# Patient Record
Sex: Male | Born: 1960 | Race: White | Hispanic: No | Marital: Married | Smoking: Never smoker
Health system: Southern US, Community
[De-identification: ages and names within clinical notes are randomized; demographics above are authoritative.]

## PROBLEM LIST (undated history)

## (undated) DIAGNOSIS — J302 Other seasonal allergic rhinitis: Secondary | ICD-10-CM

## (undated) DIAGNOSIS — E119 Type 2 diabetes mellitus without complications: Secondary | ICD-10-CM

## (undated) DIAGNOSIS — J45909 Unspecified asthma, uncomplicated: Secondary | ICD-10-CM

## (undated) DIAGNOSIS — I10 Essential (primary) hypertension: Secondary | ICD-10-CM

## (undated) DIAGNOSIS — E785 Hyperlipidemia, unspecified: Secondary | ICD-10-CM

## (undated) HISTORY — PX: COLONOSCOPY: SHX174

## (undated) HISTORY — PX: KNEE ARTHROSCOPY: SUR90

## (undated) HISTORY — PX: NASAL SEPTUM SURGERY: SHX37

---

## 2007-07-24 ENCOUNTER — Encounter: Admission: RE | Admit: 2007-07-24 | Discharge: 2007-07-24 | Payer: Self-pay | Admitting: Family Medicine

## 2012-04-13 ENCOUNTER — Ambulatory Visit
Admission: RE | Admit: 2012-04-13 | Discharge: 2012-04-13 | Disposition: A | Discharge status: Home / Self Care | Payer: BC Managed Care – PPO | Source: Ambulatory Visit | Attending: Family Medicine | Admitting: Family Medicine

## 2012-04-13 ENCOUNTER — Other Ambulatory Visit: Payer: Self-pay | Admitting: Family Medicine

## 2012-04-13 DIAGNOSIS — T1490XA Injury, unspecified, initial encounter: Secondary | ICD-10-CM

## 2013-09-04 IMAGING — CR DG FINGER MIDDLE 2+V*L*
3 series · 3 of 3 positions shown · non-contrast
Comparison: None.

CLINICAL DATA: Injured left middle finger.

LEFT MIDDLE FINGER 2+V

[x finger pa left]
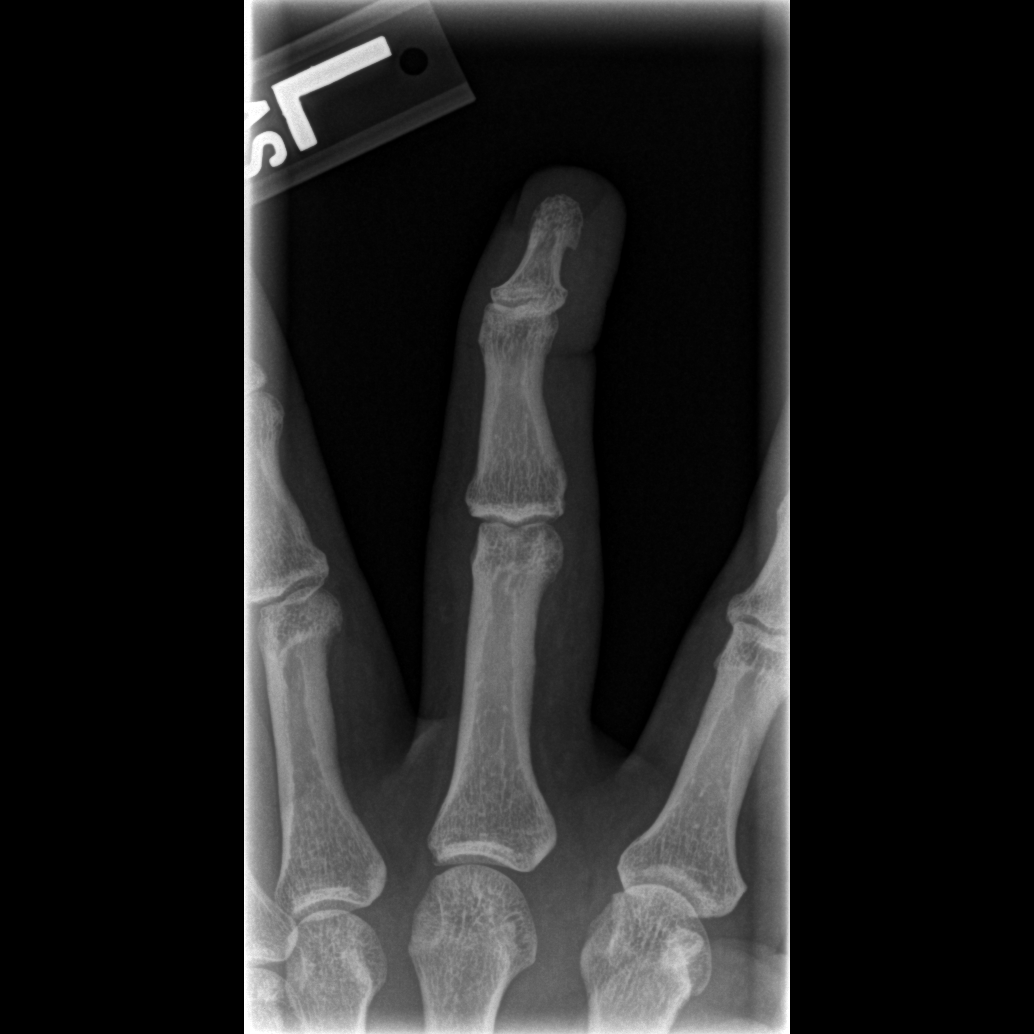

[x finger obl. left]
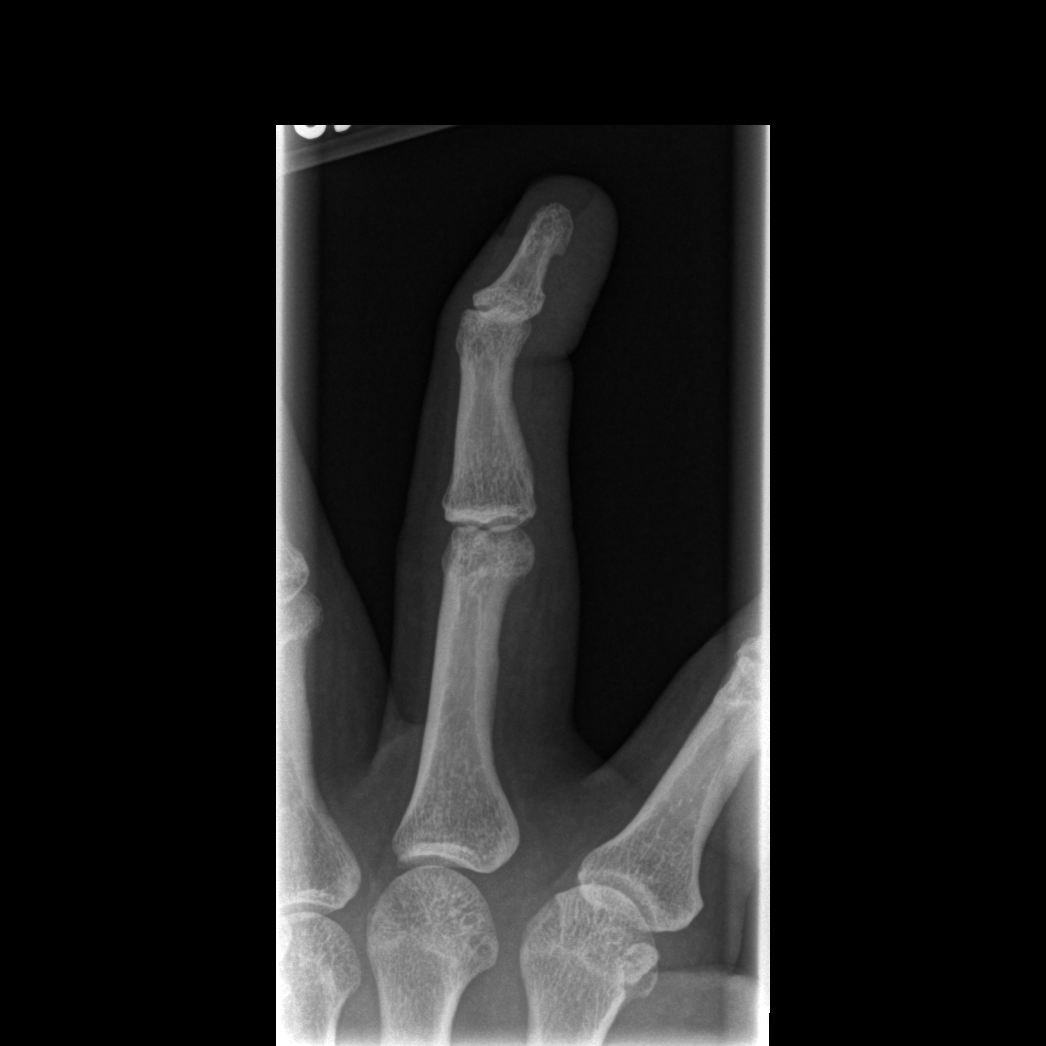

[x finger lateral left]
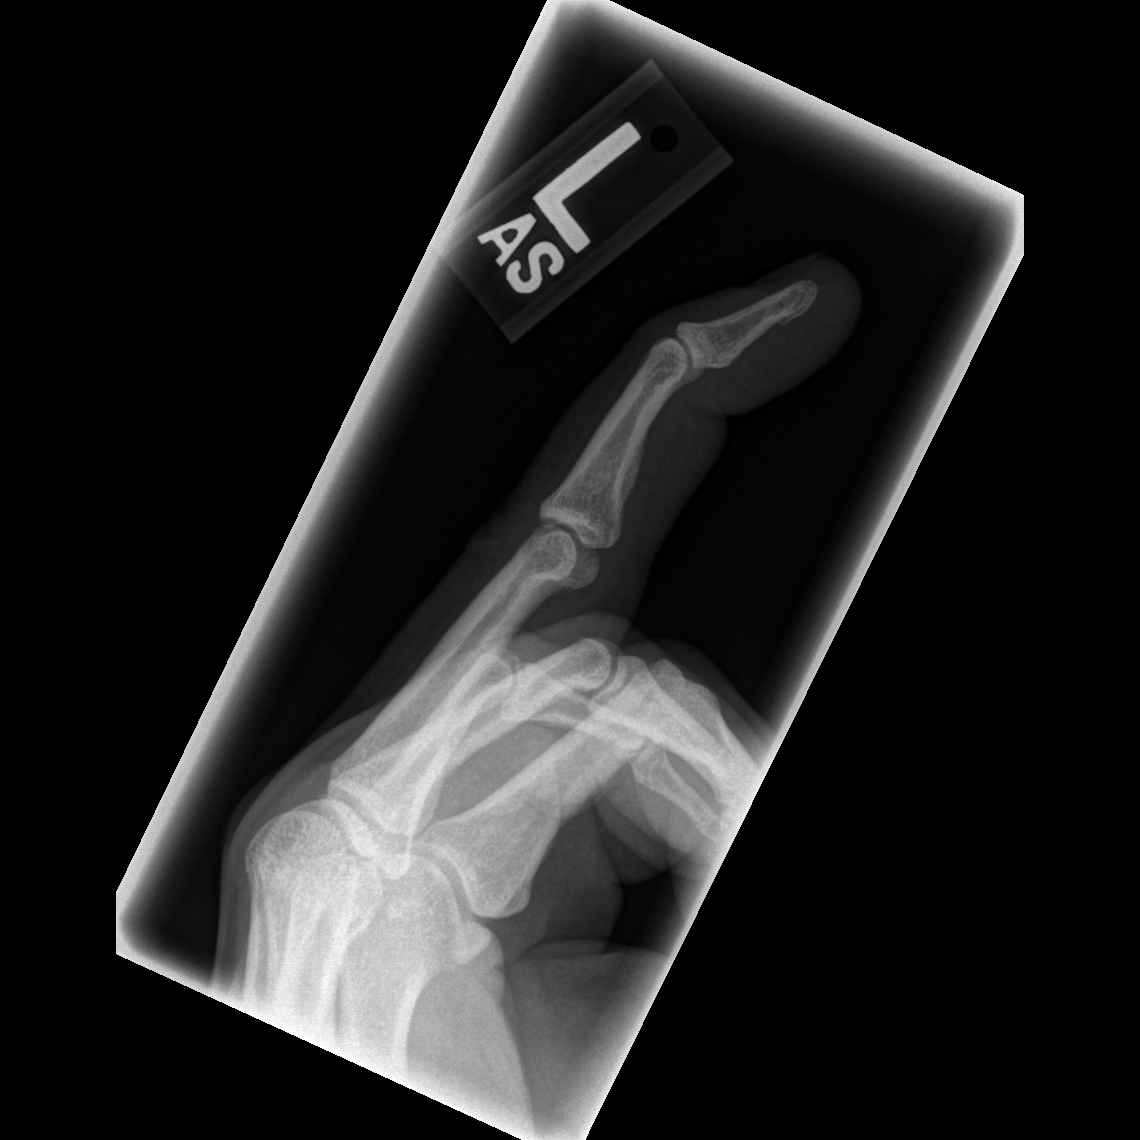

[3 of 3 positions shown; findings below may reference images not displayed]

FINDINGS: There is a flexion deformity at the DIP joint.  No
fracture is identified.  Findings consistent with an extensor
tendon rupture.
IMPRESSION: 1.  No acute fracture.
2.  Flexion deformity at the DIP joint consistent with an extensor
tendon rupture.

## 2013-10-24 ENCOUNTER — Telehealth: Payer: Self-pay | Admitting: Cardiology

## 2013-10-24 NOTE — Telephone Encounter (Signed)
Please let patient now that he had very mild sleep apnea with no significant drop in O2 sats during sleep. Most of his apneas were while he was sleeping on his back. I have recommended that he try to avoid sleeping supine. His epworth sleepiness scale was very low at 1 so I do not think that CPAP is warranted at this time. I have recommended that his PCP consider a referral to ENT for surgical evaluation of mild OSA and snoring.  Please forward to PCP

## 2013-10-25 NOTE — Telephone Encounter (Signed)
LVM to make pt aware and faxed results to PCP Dr. Blair Heys

## 2013-11-27 ENCOUNTER — Encounter: Payer: Self-pay | Admitting: Cardiology

## 2016-07-25 DIAGNOSIS — I10 Essential (primary) hypertension: Secondary | ICD-10-CM | POA: Diagnosis not present

## 2016-07-25 DIAGNOSIS — E782 Mixed hyperlipidemia: Secondary | ICD-10-CM | POA: Diagnosis not present

## 2016-07-25 DIAGNOSIS — E119 Type 2 diabetes mellitus without complications: Secondary | ICD-10-CM | POA: Diagnosis not present

## 2017-01-06 DIAGNOSIS — L719 Rosacea, unspecified: Secondary | ICD-10-CM | POA: Diagnosis not present

## 2017-01-06 DIAGNOSIS — L259 Unspecified contact dermatitis, unspecified cause: Secondary | ICD-10-CM | POA: Diagnosis not present

## 2017-01-26 DIAGNOSIS — E119 Type 2 diabetes mellitus without complications: Secondary | ICD-10-CM | POA: Diagnosis not present

## 2017-01-26 DIAGNOSIS — E782 Mixed hyperlipidemia: Secondary | ICD-10-CM | POA: Diagnosis not present

## 2017-01-26 DIAGNOSIS — I10 Essential (primary) hypertension: Secondary | ICD-10-CM | POA: Diagnosis not present

## 2017-03-20 DIAGNOSIS — E119 Type 2 diabetes mellitus without complications: Secondary | ICD-10-CM | POA: Diagnosis not present

## 2017-06-29 DIAGNOSIS — L570 Actinic keratosis: Secondary | ICD-10-CM | POA: Diagnosis not present

## 2017-07-27 DIAGNOSIS — E1165 Type 2 diabetes mellitus with hyperglycemia: Secondary | ICD-10-CM | POA: Diagnosis not present

## 2017-07-27 DIAGNOSIS — Z125 Encounter for screening for malignant neoplasm of prostate: Secondary | ICD-10-CM | POA: Diagnosis not present

## 2017-07-27 DIAGNOSIS — I10 Essential (primary) hypertension: Secondary | ICD-10-CM | POA: Diagnosis not present

## 2017-07-27 DIAGNOSIS — E782 Mixed hyperlipidemia: Secondary | ICD-10-CM | POA: Diagnosis not present

## 2017-07-27 DIAGNOSIS — Z7984 Long term (current) use of oral hypoglycemic drugs: Secondary | ICD-10-CM | POA: Diagnosis not present

## 2018-01-29 DIAGNOSIS — E782 Mixed hyperlipidemia: Secondary | ICD-10-CM | POA: Diagnosis not present

## 2018-01-29 DIAGNOSIS — E119 Type 2 diabetes mellitus without complications: Secondary | ICD-10-CM | POA: Diagnosis not present

## 2018-01-29 DIAGNOSIS — I10 Essential (primary) hypertension: Secondary | ICD-10-CM | POA: Diagnosis not present

## 2018-03-22 DIAGNOSIS — E119 Type 2 diabetes mellitus without complications: Secondary | ICD-10-CM | POA: Diagnosis not present

## 2018-05-17 DIAGNOSIS — L718 Other rosacea: Secondary | ICD-10-CM | POA: Diagnosis not present

## 2018-05-17 DIAGNOSIS — L309 Dermatitis, unspecified: Secondary | ICD-10-CM | POA: Diagnosis not present

## 2018-05-17 DIAGNOSIS — L218 Other seborrheic dermatitis: Secondary | ICD-10-CM | POA: Diagnosis not present

## 2018-07-30 DIAGNOSIS — I10 Essential (primary) hypertension: Secondary | ICD-10-CM | POA: Diagnosis not present

## 2018-07-30 DIAGNOSIS — E782 Mixed hyperlipidemia: Secondary | ICD-10-CM | POA: Diagnosis not present

## 2018-07-30 DIAGNOSIS — E119 Type 2 diabetes mellitus without complications: Secondary | ICD-10-CM | POA: Diagnosis not present

## 2018-11-01 DIAGNOSIS — R197 Diarrhea, unspecified: Secondary | ICD-10-CM | POA: Diagnosis not present

## 2018-11-01 DIAGNOSIS — R109 Unspecified abdominal pain: Secondary | ICD-10-CM | POA: Diagnosis not present

## 2018-11-02 DIAGNOSIS — R197 Diarrhea, unspecified: Secondary | ICD-10-CM | POA: Diagnosis not present

## 2019-01-30 DIAGNOSIS — I10 Essential (primary) hypertension: Secondary | ICD-10-CM | POA: Diagnosis not present

## 2019-01-30 DIAGNOSIS — E782 Mixed hyperlipidemia: Secondary | ICD-10-CM | POA: Diagnosis not present

## 2019-01-30 DIAGNOSIS — E119 Type 2 diabetes mellitus without complications: Secondary | ICD-10-CM | POA: Diagnosis not present

## 2019-05-24 DIAGNOSIS — Z03818 Encounter for observation for suspected exposure to other biological agents ruled out: Secondary | ICD-10-CM | POA: Diagnosis not present

## 2019-07-19 DIAGNOSIS — K5730 Diverticulosis of large intestine without perforation or abscess without bleeding: Secondary | ICD-10-CM | POA: Diagnosis not present

## 2019-07-19 DIAGNOSIS — K648 Other hemorrhoids: Secondary | ICD-10-CM | POA: Diagnosis not present

## 2019-07-19 DIAGNOSIS — Z86010 Personal history of colonic polyps: Secondary | ICD-10-CM | POA: Diagnosis not present

## 2019-07-19 DIAGNOSIS — D124 Benign neoplasm of descending colon: Secondary | ICD-10-CM | POA: Diagnosis not present

## 2019-07-31 DIAGNOSIS — Z125 Encounter for screening for malignant neoplasm of prostate: Secondary | ICD-10-CM | POA: Diagnosis not present

## 2019-07-31 DIAGNOSIS — I10 Essential (primary) hypertension: Secondary | ICD-10-CM | POA: Diagnosis not present

## 2019-07-31 DIAGNOSIS — L719 Rosacea, unspecified: Secondary | ICD-10-CM | POA: Diagnosis not present

## 2019-07-31 DIAGNOSIS — E119 Type 2 diabetes mellitus without complications: Secondary | ICD-10-CM | POA: Diagnosis not present

## 2019-07-31 DIAGNOSIS — E782 Mixed hyperlipidemia: Secondary | ICD-10-CM | POA: Diagnosis not present

## 2019-08-12 DIAGNOSIS — E119 Type 2 diabetes mellitus without complications: Secondary | ICD-10-CM | POA: Diagnosis not present

## 2020-01-07 DIAGNOSIS — Z1152 Encounter for screening for COVID-19: Secondary | ICD-10-CM | POA: Diagnosis not present

## 2020-01-09 ENCOUNTER — Other Ambulatory Visit: Payer: Self-pay

## 2020-01-09 ENCOUNTER — Encounter (HOSPITAL_COMMUNITY): Payer: Self-pay

## 2020-01-09 ENCOUNTER — Inpatient Hospital Stay (HOSPITAL_COMMUNITY)
Admission: EM | Admit: 2020-01-09 | Discharge: 2020-01-14 | DRG: 177 | Disposition: A | Discharge status: Home / Self Care | Payer: BC Managed Care – PPO | Attending: Internal Medicine | Admitting: Internal Medicine

## 2020-01-09 ENCOUNTER — Emergency Department (HOSPITAL_COMMUNITY): Payer: BC Managed Care – PPO

## 2020-01-09 DIAGNOSIS — Z23 Encounter for immunization: Secondary | ICD-10-CM | POA: Diagnosis not present

## 2020-01-09 DIAGNOSIS — J45909 Unspecified asthma, uncomplicated: Secondary | ICD-10-CM | POA: Diagnosis not present

## 2020-01-09 DIAGNOSIS — E1165 Type 2 diabetes mellitus with hyperglycemia: Secondary | ICD-10-CM

## 2020-01-09 DIAGNOSIS — J9601 Acute respiratory failure with hypoxia: Secondary | ICD-10-CM | POA: Diagnosis not present

## 2020-01-09 DIAGNOSIS — U071 COVID-19: Secondary | ICD-10-CM | POA: Diagnosis not present

## 2020-01-09 DIAGNOSIS — T380X5A Adverse effect of glucocorticoids and synthetic analogues, initial encounter: Secondary | ICD-10-CM | POA: Diagnosis not present

## 2020-01-09 DIAGNOSIS — N400 Enlarged prostate without lower urinary tract symptoms: Secondary | ICD-10-CM | POA: Diagnosis not present

## 2020-01-09 DIAGNOSIS — I10 Essential (primary) hypertension: Secondary | ICD-10-CM | POA: Diagnosis present

## 2020-01-09 DIAGNOSIS — E0965 Drug or chemical induced diabetes mellitus with hyperglycemia: Secondary | ICD-10-CM | POA: Diagnosis not present

## 2020-01-09 DIAGNOSIS — E785 Hyperlipidemia, unspecified: Secondary | ICD-10-CM | POA: Diagnosis not present

## 2020-01-09 DIAGNOSIS — E871 Hypo-osmolality and hyponatremia: Secondary | ICD-10-CM | POA: Diagnosis not present

## 2020-01-09 DIAGNOSIS — D696 Thrombocytopenia, unspecified: Secondary | ICD-10-CM | POA: Diagnosis present

## 2020-01-09 DIAGNOSIS — J1282 Pneumonia due to coronavirus disease 2019: Secondary | ICD-10-CM | POA: Diagnosis present

## 2020-01-09 DIAGNOSIS — R197 Diarrhea, unspecified: Secondary | ICD-10-CM | POA: Diagnosis present

## 2020-01-09 DIAGNOSIS — J45901 Unspecified asthma with (acute) exacerbation: Secondary | ICD-10-CM | POA: Diagnosis not present

## 2020-01-09 DIAGNOSIS — F1010 Alcohol abuse, uncomplicated: Secondary | ICD-10-CM | POA: Diagnosis not present

## 2020-01-09 DIAGNOSIS — R0602 Shortness of breath: Secondary | ICD-10-CM | POA: Diagnosis not present

## 2020-01-09 HISTORY — DX: Unspecified asthma, uncomplicated: J45.909

## 2020-01-09 HISTORY — DX: Essential (primary) hypertension: I10

## 2020-01-09 HISTORY — DX: Type 2 diabetes mellitus without complications: E11.9

## 2020-01-09 LAB — CBC WITH DIFFERENTIAL/PLATELET
Abs Immature Granulocytes: 0.02 10*3/uL (ref 0.00–0.07)
Basophils Absolute: 0 10*3/uL (ref 0.0–0.1)
Basophils Relative: 0 %
Eosinophils Absolute: 0 10*3/uL (ref 0.0–0.5)
Eosinophils Relative: 0 %
HCT: 44 % (ref 39.0–52.0)
Hemoglobin: 14.9 g/dL (ref 13.0–17.0)
Immature Granulocytes: 0 %
Lymphocytes Relative: 22 %
Lymphs Abs: 1.1 10*3/uL (ref 0.7–4.0)
MCH: 31.9 pg (ref 26.0–34.0)
MCHC: 33.9 g/dL (ref 30.0–36.0)
MCV: 94.2 fL (ref 80.0–100.0)
Monocytes Absolute: 0.6 10*3/uL (ref 0.1–1.0)
Monocytes Relative: 12 %
Neutro Abs: 3.4 10*3/uL (ref 1.7–7.7)
Neutrophils Relative %: 66 %
Platelets: 144 10*3/uL — ABNORMAL LOW (ref 150–400)
RBC: 4.67 MIL/uL (ref 4.22–5.81)
RDW: 12.2 % (ref 11.5–15.5)
WBC: 5.1 10*3/uL (ref 4.0–10.5)
nRBC: 0 % (ref 0.0–0.2)

## 2020-01-09 LAB — GLUCOSE, CAPILLARY
Glucose-Capillary: 292 mg/dL — ABNORMAL HIGH (ref 70–99)
Glucose-Capillary: 385 mg/dL — ABNORMAL HIGH (ref 70–99)

## 2020-01-09 LAB — COMPREHENSIVE METABOLIC PANEL
ALT: 48 U/L — ABNORMAL HIGH (ref 0–44)
AST: 43 U/L — ABNORMAL HIGH (ref 15–41)
Albumin: 3.2 g/dL — ABNORMAL LOW (ref 3.5–5.0)
Alkaline Phosphatase: 42 U/L (ref 38–126)
Anion gap: 10 (ref 5–15)
BUN: 21 mg/dL — ABNORMAL HIGH (ref 6–20)
CO2: 24 mmol/L (ref 22–32)
Calcium: 8.3 mg/dL — ABNORMAL LOW (ref 8.9–10.3)
Chloride: 98 mmol/L (ref 98–111)
Creatinine, Ser: 1.14 mg/dL (ref 0.61–1.24)
GFR calc Af Amer: >60 mL/min (ref >60)
GFR calc non Af Amer: >60 mL/min (ref >60)
Glucose, Bld: 268 mg/dL — ABNORMAL HIGH (ref 70–99)
Potassium: 4.1 mmol/L (ref 3.5–5.1)
Sodium: 132 mmol/L — ABNORMAL LOW (ref 135–145)
Total Bilirubin: 0.6 mg/dL (ref 0.3–1.2)
Total Protein: 6.9 g/dL (ref 6.5–8.1)

## 2020-01-09 LAB — PHOSPHORUS: Phosphorus: 2.7 mg/dL (ref 2.5–4.6)

## 2020-01-09 LAB — TRIGLYCERIDES: Triglycerides: 211 mg/dL — ABNORMAL HIGH (ref <150)

## 2020-01-09 LAB — LACTIC ACID, PLASMA: Lactic Acid, Venous: 1.5 mmol/L (ref 0.5–1.9)

## 2020-01-09 LAB — LACTATE DEHYDROGENASE: LDH: 260 U/L — ABNORMAL HIGH (ref 98–192)

## 2020-01-09 LAB — PROCALCITONIN: Procalcitonin: <0.1 ng/mL

## 2020-01-09 LAB — C-REACTIVE PROTEIN: CRP: 1 mg/dL — ABNORMAL HIGH (ref <1.0)

## 2020-01-09 LAB — HIV ANTIBODY (ROUTINE TESTING W REFLEX): HIV Screen 4th Generation wRfx: NONREACTIVE

## 2020-01-09 LAB — FERRITIN: Ferritin: 1246 ng/mL — ABNORMAL HIGH (ref 24–336)

## 2020-01-09 LAB — POC SARS CORONAVIRUS 2 AG -  ED: SARS Coronavirus 2 Ag: POSITIVE — AB

## 2020-01-09 LAB — MAGNESIUM: Magnesium: 1.9 mg/dL (ref 1.7–2.4)

## 2020-01-09 LAB — HEMOGLOBIN A1C
Hgb A1c MFr Bld: 8.2 % — ABNORMAL HIGH (ref 4.8–5.6)
Mean Plasma Glucose: 188.64 mg/dL

## 2020-01-09 LAB — D-DIMER, QUANTITATIVE: D-Dimer, Quant: 0.57 ug/mL-FEU — ABNORMAL HIGH (ref 0.00–0.50)

## 2020-01-09 LAB — FIBRINOGEN: Fibrinogen: 657 mg/dL — ABNORMAL HIGH (ref 210–475)

## 2020-01-09 MED ORDER — HYDRALAZINE HCL 25 MG PO TABS: 25.0000 mg | ORAL_TABLET | Freq: Four times a day (QID) | ORAL | Status: DC | PRN

## 2020-01-09 MED ORDER — THIAMINE HCL 100 MG PO TABS
100.0000 mg | ORAL_TABLET | Freq: Every day | ORAL | Status: DC
  Administered 2020-01-09 – 2020-01-14 (×6): 100 mg via ORAL
  Filled 2020-01-09 (×6): qty 1

## 2020-01-09 MED ORDER — SODIUM CHLORIDE 0.9 % IV SOLN
200.0000 mg | Freq: Once | INTRAVENOUS | Status: AC
  Administered 2020-01-09: 200 mg via INTRAVENOUS
  Filled 2020-01-09 (×2): qty 40

## 2020-01-09 MED ORDER — DEXAMETHASONE 6 MG PO TABS
6.0000 mg | ORAL_TABLET | Freq: Every day | ORAL | Status: DC
  Administered 2020-01-09 – 2020-01-10 (×2): 6 mg via ORAL
  Filled 2020-01-09: qty 2
  Filled 2020-01-09 (×2): qty 1

## 2020-01-09 MED ORDER — ASCORBIC ACID 500 MG PO TABS
500.0000 mg | ORAL_TABLET | Freq: Every day | ORAL | Status: DC
  Administered 2020-01-09 – 2020-01-14 (×6): 500 mg via ORAL
  Filled 2020-01-09 (×6): qty 1

## 2020-01-09 MED ORDER — ENOXAPARIN SODIUM 40 MG/0.4ML ~~LOC~~ SOLN: 40.0000 mg | SUBCUTANEOUS | Status: DC

## 2020-01-09 MED ORDER — FOLIC ACID 1 MG PO TABS
1.0000 mg | ORAL_TABLET | Freq: Every day | ORAL | Status: DC
  Administered 2020-01-09 – 2020-01-14 (×6): 1 mg via ORAL
  Filled 2020-01-09 (×6): qty 1

## 2020-01-09 MED ORDER — ZINC SULFATE 220 (50 ZN) MG PO CAPS
220.0000 mg | ORAL_CAPSULE | Freq: Every day | ORAL | Status: DC
  Administered 2020-01-09 – 2020-01-14 (×6): 220 mg via ORAL
  Filled 2020-01-09 (×7): qty 1

## 2020-01-09 MED ORDER — GUAIFENESIN-DM 100-10 MG/5ML PO SYRP
10.0000 mL | ORAL_SOLUTION | ORAL | Status: DC | PRN
  Administered 2020-01-12: 10 mL via ORAL
  Filled 2020-01-09: qty 10

## 2020-01-09 MED ORDER — METFORMIN HCL 500 MG PO TABS
500.0000 mg | ORAL_TABLET | Freq: Two times a day (BID) | ORAL | Status: DC
  Administered 2020-01-09 – 2020-01-10 (×2): 500 mg via ORAL
  Filled 2020-01-09 (×2): qty 1

## 2020-01-09 MED ORDER — ADULT MULTIVITAMIN W/MINERALS CH
1.0000 | ORAL_TABLET | Freq: Every day | ORAL | Status: DC
  Administered 2020-01-09 – 2020-01-14 (×6): 1 via ORAL
  Filled 2020-01-09 (×6): qty 1

## 2020-01-09 MED ORDER — SODIUM CHLORIDE 0.9 % IV SOLN
100.0000 mg | Freq: Every day | INTRAVENOUS | Status: AC
  Administered 2020-01-10 – 2020-01-13 (×4): 100 mg via INTRAVENOUS
  Filled 2020-01-09 (×4): qty 20

## 2020-01-09 MED ORDER — ALBUTEROL SULFATE HFA 108 (90 BASE) MCG/ACT IN AERS
4.0000 | INHALATION_SPRAY | RESPIRATORY_TRACT | Status: DC | PRN
  Filled 2020-01-09: qty 6.7

## 2020-01-09 MED ORDER — LORAZEPAM 1 MG PO TABS: 1.0000 mg | ORAL_TABLET | ORAL | Status: AC | PRN

## 2020-01-09 MED ORDER — ALBUTEROL SULFATE HFA 108 (90 BASE) MCG/ACT IN AERS
2.0000 | INHALATION_SPRAY | Freq: Four times a day (QID) | RESPIRATORY_TRACT | Status: DC
  Administered 2020-01-09 – 2020-01-10 (×6): 2 via RESPIRATORY_TRACT
  Filled 2020-01-09: qty 6.7

## 2020-01-09 MED ORDER — LOPERAMIDE HCL 2 MG PO CAPS
2.0000 mg | ORAL_CAPSULE | Freq: Four times a day (QID) | ORAL | Status: DC | PRN
  Filled 2020-01-09: qty 1

## 2020-01-09 MED ORDER — ACETAMINOPHEN 325 MG PO TABS: 650.0000 mg | ORAL_TABLET | Freq: Four times a day (QID) | ORAL | Status: DC | PRN

## 2020-01-09 MED ORDER — INSULIN ASPART 100 UNIT/ML ~~LOC~~ SOLN
0.0000 [IU] | Freq: Three times a day (TID) | SUBCUTANEOUS | Status: DC
  Administered 2020-01-09: 5 [IU] via SUBCUTANEOUS
  Administered 2020-01-10 – 2020-01-11 (×4): 7 [IU] via SUBCUTANEOUS
  Administered 2020-01-11: 9 [IU] via SUBCUTANEOUS
  Administered 2020-01-11: 5 [IU] via SUBCUTANEOUS
  Administered 2020-01-12: 3 [IU] via SUBCUTANEOUS
  Administered 2020-01-12: 9 [IU] via SUBCUTANEOUS
  Administered 2020-01-12: 3 [IU] via SUBCUTANEOUS
  Administered 2020-01-13: 17:00:00 9 [IU] via SUBCUTANEOUS
  Administered 2020-01-13: 12:00:00 3 [IU] via SUBCUTANEOUS
  Administered 2020-01-13: 2 [IU] via SUBCUTANEOUS
  Administered 2020-01-14: 1 [IU] via SUBCUTANEOUS
  Administered 2020-01-14: 5 [IU] via SUBCUTANEOUS

## 2020-01-09 MED ORDER — ENOXAPARIN SODIUM 60 MG/0.6ML ~~LOC~~ SOLN
55.0000 mg | SUBCUTANEOUS | Status: DC
  Administered 2020-01-09 – 2020-01-13 (×5): 55 mg via SUBCUTANEOUS
  Filled 2020-01-09 (×5): qty 0.6

## 2020-01-09 MED ORDER — LORAZEPAM 2 MG/ML IJ SOLN: 1.0000 mg | INTRAMUSCULAR | Status: AC | PRN

## 2020-01-09 MED ORDER — PNEUMOCOCCAL VAC POLYVALENT 25 MCG/0.5ML IJ INJ
0.5000 mL | INJECTION | INTRAMUSCULAR | Status: AC
  Administered 2020-01-10: 0.5 mL via INTRAMUSCULAR
  Filled 2020-01-09: qty 0.5

## 2020-01-09 MED ORDER — INFLUENZA VAC SPLIT QUAD 0.5 ML IM SUSY
0.5000 mL | PREFILLED_SYRINGE | INTRAMUSCULAR | Status: AC
  Administered 2020-01-10: 0.5 mL via INTRAMUSCULAR
  Filled 2020-01-09: qty 0.5

## 2020-01-09 MED ORDER — THIAMINE HCL 100 MG/ML IJ SOLN
100.0000 mg | Freq: Every day | INTRAMUSCULAR | Status: DC
  Filled 2020-01-09: qty 2

## 2020-01-09 NOTE — ED Triage Notes (Signed)
Pt here from home covid positive has been feeling bad since Saturday and tested positive on  Tuesday , pt does have asthma stas 94 % in triage

## 2020-01-09 NOTE — ED Provider Notes (Signed)
[facility] EMERGENCY DEPARTMENT Provider Note   CSN: [id number] Arrival date & time: 01/09/20  8891     History No chief complaint on file.   Earl Francis is a 59 y.o. male.  HPI Patient presents with shortness of breath.  Covid positive as an outpatient 4 days ago.  Symptoms began 6 days ago.  Has had a cough.  Fatigue.  Diarrhea.  History of asthma.  States he normally only has to use his inhaler like twice a year.  Has been much more short of breath.  States he has trouble getting up and moving around.  States he has a pulse ox at home that would go down to 84 or 88%.  States this would occur at rest.    Past Medical History:  Diagnosis Date  . Asthma   . Diabetes mellitus without complication (HCC)   . Hypertension     There are no problems to display for this patient.   Past Surgical History:  Procedure Laterality Date  . KNEE ARTHROSCOPY Left        History reviewed. No pertinent family history.  Social History   Tobacco Use  . Smoking status: Never Smoker  . Smokeless tobacco: Never Used  Substance Use Topics  . Alcohol use: Yes    Alcohol/week: 4.0 standard drinks    Types: 4 Cans of beer per week  . Drug use: Not Currently    Home Medications Prior to Admission medications   Not on File    Allergies    Patient has no allergy information on record.  Review of Systems   Review of Systems  Constitutional: Positive for appetite change, fatigue and fever.  HENT: Negative for congestion.   Respiratory: Positive for cough and shortness of breath.   Cardiovascular: Negative for chest pain and leg swelling.  Gastrointestinal: Negative for abdominal pain.  Genitourinary: Negative for flank pain.  Musculoskeletal: Negative for back pain.  Skin: Negative for rash.  Neurological: Negative for weakness.  Psychiatric/Behavioral: Negative for confusion.    Physical Exam Updated Vital Signs BP 127/70   Pulse 93   Temp 99.4 F  (37.4 C) (Oral)   Resp (!) 22   Ht 5\' 11"  (1.803 m)   Wt 106.6 kg   SpO2 93%   BMI 32.78 kg/m   Physical Exam Vitals and nursing note reviewed.  HENT:     Head: Normocephalic.     Mouth/Throat:     Mouth: Mucous membranes are moist.  Eyes:     General: No scleral icterus. Cardiovascular:     Rate and Rhythm: Regular rhythm.  Pulmonary:     Breath sounds: No wheezing or rhonchi.     Comments: Tachypnea.  Patient seems more short of breath wearing his mask.  Lungs overall clear. Abdominal:     Tenderness: There is no abdominal tenderness.  Musculoskeletal:     Right lower leg: No edema.     Left lower leg: No edema.  Skin:    General: Skin is warm.     Capillary Refill: Capillary refill takes less than 2 seconds.  Neurological:     Mental Status: He is alert and oriented to person, place, and time.     ED Results / Procedures / Treatments   Labs (all labs ordered are listed, but only abnormal results are displayed) Labs Reviewed  CBC WITH DIFFERENTIAL/PLATELET - Abnormal; Notable for the following components:      Result Value   Platelets 144 (*)  All other components within normal limits  COMPREHENSIVE METABOLIC PANEL - Abnormal; Notable for the following components:   Sodium 132 (*)    Glucose, Bld 268 (*)    BUN 21 (*)    Calcium 8.3 (*)    Albumin 3.2 (*)    AST 43 (*)    ALT 48 (*)    All other components within normal limits  D-DIMER, QUANTITATIVE (NOT AT [facility]) - Abnormal; Notable for the following components:   D-Dimer, Quant 0.57 (*)    All other components within normal limits  LACTATE DEHYDROGENASE - Abnormal; Notable for the following components:   LDH 260 (*)    All other components within normal limits  TRIGLYCERIDES - Abnormal; Notable for the following components:   Triglycerides 211 (*)    All other components within normal limits  FIBRINOGEN - Abnormal; Notable for the following components:   Fibrinogen 657 (*)    All other components  within normal limits  POC SARS CORONAVIRUS 2 AG -  ED - Abnormal; Notable for the following components:   SARS Coronavirus 2 Ag POSITIVE (*)    All other components within normal limits  CULTURE, BLOOD (ROUTINE X 2)  CULTURE, BLOOD (ROUTINE X 2)  LACTIC ACID, PLASMA  LACTIC ACID, PLASMA  PROCALCITONIN  FERRITIN  C-REACTIVE PROTEIN    EKG EKG Interpretation  Date/Time:  Thursday January 09 2020 10:02:32 EST Ventricular Rate:  88 PR Interval:    QRS Duration: 102 QT Interval:  371 QTC Calculation: 449 R Axis:   -6 Text Interpretation: Sinus rhythm Confirmed by Davonna Belling [phone]) on 01/09/2020 11:00:55 AM   Radiology DG Chest Port 1 View  Result Date: 01/09/2020 CLINICAL DATA:  COVID-19 positive.  Short of breath EXAM: PORTABLE CHEST 1 VIEW COMPARISON:  None. FINDINGS: Mild bibasilar airspace disease left greater than right. Possible pneumonia or atelectasis. Decreased lung volume. Negative for heart failure or effusion. IMPRESSION: Mild bibasilar airspace disease which could be pneumonia or atelectasis. Hypoventilation with decreased lung volume. Electronically Signed   By: Franchot Gallo M.D.   On: 01/09/2020 10:56    Procedures Procedures (including critical care time)  Medications Ordered in ED Medications  albuterol (VENTOLIN HFA) 108 (90 Base) MCG/ACT inhaler 4 puff (has no administration in time range)    ED Course  I have reviewed the triage vital signs and the nursing notes.  Pertinent labs & imaging results that were available during my care of the patient were reviewed by me and considered in my medical decision making (see chart for details).    MDM Rules/Calculators/A&P                      Patient with COVID-19.  History of asthma at home.  Increasing dyspnea.  With home monitoring sats went down to 84%.  Very dyspneic here.  Sats at rest down to 93.  With ambulation he did not drop but respiratory rate increases.  At rest when I reevaluated him his  respiratory rate was 33.  Feels the patient may benefit from admission to the hospital for further treatment.  Will discuss with hospitalist. Final Clinical Impression(s) / ED Diagnoses Final diagnoses:  COVID-19  Asthma, unspecified asthma severity, unspecified whether complicated, unspecified whether persistent    Rx / DC Orders ED Discharge Orders    None       Davonna Belling, MD 01/09/20 1148

## 2020-01-09 NOTE — ED Notes (Signed)
Pt ambulatory in room 95-97% on RA

## 2020-01-09 NOTE — H&P (Addendum)
History and Physical    Earsel Francis GYJ:[id number] DOB: [birth year] DOA: 01/09/2020  PCP: System, Pcp Not In   Patient coming from: Home  I have personally briefly reviewed patient's old medical records in [place]  Chief Complaint: SOB  HPI: Earl Francis is a 59 y.o. male with medical history significant of IDDM, HTN, presented with increasing short of breath and hypoxia for 8 days.  Covid positive as an outpatient 4 days ago.    He has had dry cough, fatigue,  loose diarrhea, low-grade subjective fever with no chills.    For last 2 days, he has been much more short of breath.  States he has trouble getting up and moving around.  He checked his pulse ox at home last night, which was 84 or 88%.    This morning his breathing symptoms worsen, significant short of breath with minimum activity. ED Course: ED physician found the patient very tachypneic, talking in broken sentences, breathing rate increased to >30s with minimum activity.  Review of Systems: As per HPI otherwise 10 point review of systems negative.    Past Medical History:  Diagnosis Date  . Asthma   . Diabetes mellitus without complication ([place])   . Hypertension     Past Surgical History:  Procedure Laterality Date  . KNEE ARTHROSCOPY Left      reports that he has never smoked. He has never used smokeless tobacco. He reports current alcohol use of about 4.0 standard drinks of alcohol per week. He reports previous drug use.  Not on File  FH, no DM or HTN in family    Prior to Admission medications   Not on File    Physical Exam: Vitals:   01/09/20 0916 01/09/20 1013 01/09/20 1149  BP: 127/70    Pulse: 93    Resp: (!) 22    Temp: 99.4 F (37.4 C)    TempSrc: Oral    SpO2: 93%  94%  Weight:  106.6 kg   Height:  5\' 11"  (1.803 m)     Constitutional: NAD, calm, comfortable Vitals:   01/09/20 0916 01/09/20 1013 01/09/20 1149  BP: 127/70    Pulse: 93    Resp: (!) 22    Temp: 99.4 F (37.4 C)     TempSrc: Oral    SpO2: 93%  94%  Weight:  106.6 kg   Height:  5\' 11"  (1.803 m)    Eyes: PERRL, lids and conjunctivae normal ENMT: Mucous membranes are moist. Posterior pharynx clear of any exudate or lesions.Normal dentition.  Neck: normal, supple, no masses, no thyromegaly Respiratory: Coarse to bilateral mid levels, and scattered wheezing.  Increasing respiratory effort, talking in broken sentences.  Positive for accessory muscle use.  Cardiovascular: Regular rate and rhythm, no murmurs / rubs / gallops. No extremity edema. 2+ pedal pulses. No carotid bruits.  Abdomen: no tenderness, no masses palpated. No hepatosplenomegaly. Bowel sounds positive.  Musculoskeletal: no clubbing / cyanosis. No joint deformity upper and lower extremities. Good ROM, no contractures. Normal muscle tone.  Skin: no rashes, lesions, ulcers. No induration Neurologic: CN 2-12 grossly intact. Sensation intact, DTR normal. Strength 5/5 in all 4.  Psychiatric: Normal judgment and insight. Alert and oriented x 3. Normal mood.     Labs on Admission: I have personally reviewed following labs and imaging studies  CBC: Recent Labs  Lab 01/09/20 1015  WBC 5.1  NEUTROABS 3.4  HGB 14.9  HCT 44.0  MCV 94.2  PLT 144*  Basic Metabolic Panel: Recent Labs  Lab 01/09/20 1015  NA 132*  K 4.1  CL 98  CO2 24  GLUCOSE 268*  BUN 21*  CREATININE 1.14  CALCIUM 8.3*   GFR: Estimated Creatinine Clearance: 89.9 mL/min (by C-G formula based on SCr of 1.14 mg/dL). Liver Function Tests: Recent Labs  Lab 01/09/20 1015  AST 43*  ALT 48*  ALKPHOS 42  BILITOT 0.6  PROT 6.9  ALBUMIN 3.2*   No results for input(s): LIPASE, AMYLASE in the last 168 hours. No results for input(s): AMMONIA in the last 168 hours. Coagulation Profile: No results for input(s): INR, PROTIME in the last 168 hours. Cardiac Enzymes: No results for input(s): CKTOTAL, CKMB, CKMBINDEX, TROPONINI in the last 168 hours. BNP (last 3  results) No results for input(s): PROBNP in the last 8760 hours. HbA1C: No results for input(s): HGBA1C in the last 72 hours. CBG: No results for input(s): GLUCAP in the last 168 hours. Lipid Profile: Recent Labs    01/09/20 0944  TRIG 211*   Thyroid Function Tests: No results for input(s): TSH, T4TOTAL, FREET4, T3FREE, THYROIDAB in the last 72 hours. Anemia Panel: No results for input(s): VITAMINB12, FOLATE, FERRITIN, TIBC, IRON, RETICCTPCT in the last 72 hours. Urine analysis: No results found for: COLORURINE, APPEARANCEUR, LABSPEC, PHURINE, GLUCOSEU, HGBUR, BILIRUBINUR, KETONESUR, PROTEINUR, UROBILINOGEN, NITRITE, LEUKOCYTESUR  Radiological Exams on Admission: DG Chest Port 1 View  Result Date: 01/09/2020 CLINICAL DATA:  COVID-19 positive.  Short of breath EXAM: PORTABLE CHEST 1 VIEW COMPARISON:  None. FINDINGS: Mild bibasilar airspace disease left greater than right. Possible pneumonia or atelectasis. Decreased lung volume. Negative for heart failure or effusion. IMPRESSION: Mild bibasilar airspace disease which could be pneumonia or atelectasis. Hypoventilation with decreased lung volume. Electronically Signed   By: Marlan Palau M.D.   On: 01/09/2020 10:56    EKG: Independently reviewed.   Assessment/Plan Active Problems:   COVID-19 virus infection   COVID-19  COVID 19 pneumonia with impending respiratory failure, discussed with ED physician patient very symptomatic with significant tachypnea, will place patient on telemetry observation for tonight and start patient on remdesivir and steroid.  COVID-19 lab reviewed.  Asthma with mild exacerbation Inhaler and steroid.  Poorly controlled diabetes, patient claimed his A1c was 6.1 last year and has been on Metformin alone therapy for 5 years. Continue Metformin, add sliding scale.  HTN, BP controlled, as needed hydralazine for now, consider restarting home BP meds.  Thrombocytopenia, unknown etiology and chronity, repeat  CBC tomorrow.  EtOH abuse, not s/s of withdrawal, will monitor CIWA with PRN meds.    DVT prophylaxis: Lovenox Code Status: Full code Family Communication: None at bedside Disposition Plan: If symptoms improved, can be discharged home tomorrow and complete remdesivir course as outpatient Consults called: None Admission status: Tele Obs   Emeline General MD [facility] Pager [phone]  If 7PM-7AM, please contact night-coverage [email or web address] Password [facility]  01/09/2020, 12:41 PM

## 2020-01-10 DIAGNOSIS — E1165 Type 2 diabetes mellitus with hyperglycemia: Secondary | ICD-10-CM | POA: Diagnosis not present

## 2020-01-10 DIAGNOSIS — F1010 Alcohol abuse, uncomplicated: Secondary | ICD-10-CM | POA: Diagnosis present

## 2020-01-10 DIAGNOSIS — J45901 Unspecified asthma with (acute) exacerbation: Secondary | ICD-10-CM | POA: Diagnosis present

## 2020-01-10 DIAGNOSIS — J1282 Pneumonia due to coronavirus disease 2019: Secondary | ICD-10-CM | POA: Diagnosis present

## 2020-01-10 DIAGNOSIS — Z23 Encounter for immunization: Secondary | ICD-10-CM | POA: Diagnosis not present

## 2020-01-10 DIAGNOSIS — D696 Thrombocytopenia, unspecified: Secondary | ICD-10-CM | POA: Diagnosis present

## 2020-01-10 DIAGNOSIS — U071 COVID-19: Secondary | ICD-10-CM | POA: Diagnosis present

## 2020-01-10 DIAGNOSIS — E871 Hypo-osmolality and hyponatremia: Secondary | ICD-10-CM | POA: Diagnosis not present

## 2020-01-10 DIAGNOSIS — N400 Enlarged prostate without lower urinary tract symptoms: Secondary | ICD-10-CM | POA: Diagnosis present

## 2020-01-10 DIAGNOSIS — E785 Hyperlipidemia, unspecified: Secondary | ICD-10-CM | POA: Diagnosis present

## 2020-01-10 DIAGNOSIS — I10 Essential (primary) hypertension: Secondary | ICD-10-CM | POA: Diagnosis present

## 2020-01-10 DIAGNOSIS — R197 Diarrhea, unspecified: Secondary | ICD-10-CM | POA: Diagnosis present

## 2020-01-10 DIAGNOSIS — T380X5A Adverse effect of glucocorticoids and synthetic analogues, initial encounter: Secondary | ICD-10-CM | POA: Diagnosis not present

## 2020-01-10 DIAGNOSIS — E0965 Drug or chemical induced diabetes mellitus with hyperglycemia: Secondary | ICD-10-CM | POA: Diagnosis not present

## 2020-01-10 DIAGNOSIS — J9601 Acute respiratory failure with hypoxia: Secondary | ICD-10-CM | POA: Diagnosis present

## 2020-01-10 LAB — CBC WITH DIFFERENTIAL/PLATELET
Abs Immature Granulocytes: 0.03 10*3/uL (ref 0.00–0.07)
Basophils Absolute: 0 10*3/uL (ref 0.0–0.1)
Basophils Relative: 0 %
Eosinophils Absolute: 0 10*3/uL (ref 0.0–0.5)
Eosinophils Relative: 0 %
HCT: 40.6 % (ref 39.0–52.0)
Hemoglobin: 14.4 g/dL (ref 13.0–17.0)
Immature Granulocytes: 1 %
Lymphocytes Relative: 23 %
Lymphs Abs: 1 10*3/uL (ref 0.7–4.0)
MCH: 32.1 pg (ref 26.0–34.0)
MCHC: 35.5 g/dL (ref 30.0–36.0)
MCV: 90.4 fL (ref 80.0–100.0)
Monocytes Absolute: 0.6 10*3/uL (ref 0.1–1.0)
Monocytes Relative: 14 %
Neutro Abs: 2.7 10*3/uL (ref 1.7–7.7)
Neutrophils Relative %: 62 %
Platelets: 167 10*3/uL (ref 150–400)
RBC: 4.49 MIL/uL (ref 4.22–5.81)
RDW: 12 % (ref 11.5–15.5)
WBC: 4.3 10*3/uL (ref 4.0–10.5)
nRBC: 0 % (ref 0.0–0.2)

## 2020-01-10 LAB — COMPREHENSIVE METABOLIC PANEL
ALT: 49 U/L — ABNORMAL HIGH (ref 0–44)
AST: 38 U/L (ref 15–41)
Albumin: 3 g/dL — ABNORMAL LOW (ref 3.5–5.0)
Alkaline Phosphatase: 43 U/L (ref 38–126)
Anion gap: 12 (ref 5–15)
BUN: 27 mg/dL — ABNORMAL HIGH (ref 6–20)
CO2: 19 mmol/L — ABNORMAL LOW (ref 22–32)
Calcium: 8.2 mg/dL — ABNORMAL LOW (ref 8.9–10.3)
Chloride: 101 mmol/L (ref 98–111)
Creatinine, Ser: 0.99 mg/dL (ref 0.61–1.24)
GFR calc Af Amer: >60 mL/min (ref >60)
GFR calc non Af Amer: >60 mL/min (ref >60)
Glucose, Bld: 336 mg/dL — ABNORMAL HIGH (ref 70–99)
Potassium: 4.4 mmol/L (ref 3.5–5.1)
Sodium: 132 mmol/L — ABNORMAL LOW (ref 135–145)
Total Bilirubin: 0.7 mg/dL (ref 0.3–1.2)
Total Protein: 6.7 g/dL (ref 6.5–8.1)

## 2020-01-10 LAB — GLUCOSE, CAPILLARY
Glucose-Capillary: 303 mg/dL — ABNORMAL HIGH (ref 70–99)
Glucose-Capillary: 329 mg/dL — ABNORMAL HIGH (ref 70–99)
Glucose-Capillary: 324 mg/dL — ABNORMAL HIGH (ref 70–99)
Glucose-Capillary: 353 mg/dL — ABNORMAL HIGH (ref 70–99)

## 2020-01-10 LAB — C-REACTIVE PROTEIN: CRP: 1.8 mg/dL — ABNORMAL HIGH (ref <1.0)

## 2020-01-10 LAB — PHOSPHORUS: Phosphorus: 3.1 mg/dL (ref 2.5–4.6)

## 2020-01-10 LAB — FERRITIN: Ferritin: 1424 ng/mL — ABNORMAL HIGH (ref 24–336)

## 2020-01-10 LAB — BRAIN NATRIURETIC PEPTIDE: B Natriuretic Peptide: 23.7 pg/mL (ref 0.0–100.0)

## 2020-01-10 LAB — D-DIMER, QUANTITATIVE: D-Dimer, Quant: 0.35 ug/mL-FEU (ref 0.00–0.50)

## 2020-01-10 LAB — MAGNESIUM: Magnesium: 2 mg/dL (ref 1.7–2.4)

## 2020-01-10 MED ORDER — IPRATROPIUM BROMIDE HFA 17 MCG/ACT IN AERS
2.0000 | INHALATION_SPRAY | Freq: Four times a day (QID) | RESPIRATORY_TRACT | Status: DC
  Administered 2020-01-10 (×2): 2 via RESPIRATORY_TRACT
  Filled 2020-01-10: qty 12.9

## 2020-01-10 MED ORDER — IPRATROPIUM BROMIDE HFA 17 MCG/ACT IN AERS
2.0000 | INHALATION_SPRAY | Freq: Three times a day (TID) | RESPIRATORY_TRACT | Status: DC
  Administered 2020-01-11 – 2020-01-13 (×7): 2 via RESPIRATORY_TRACT
  Filled 2020-01-10: qty 12.9

## 2020-01-10 MED ORDER — DEXAMETHASONE SODIUM PHOSPHATE 10 MG/ML IJ SOLN
6.0000 mg | INTRAMUSCULAR | Status: DC
  Administered 2020-01-11 – 2020-01-14 (×4): 6 mg via INTRAVENOUS
  Filled 2020-01-10 (×4): qty 1

## 2020-01-10 MED ORDER — HYDROCOD POLST-CPM POLST ER 10-8 MG/5ML PO SUER
5.0000 mL | Freq: Two times a day (BID) | ORAL | Status: DC
  Administered 2020-01-10 – 2020-01-11 (×3): 5 mL via ORAL
  Filled 2020-01-10 (×3): qty 5

## 2020-01-10 MED ORDER — INSULIN ASPART 100 UNIT/ML ~~LOC~~ SOLN
6.0000 [IU] | Freq: Three times a day (TID) | SUBCUTANEOUS | Status: DC
  Administered 2020-01-10 – 2020-01-13 (×8): 6 [IU] via SUBCUTANEOUS

## 2020-01-10 MED ORDER — INSULIN DETEMIR 100 UNIT/ML ~~LOC~~ SOLN
10.0000 [IU] | Freq: Two times a day (BID) | SUBCUTANEOUS | Status: DC
  Administered 2020-01-10 (×2): 10 [IU] via SUBCUTANEOUS
  Filled 2020-01-10 (×4): qty 0.1

## 2020-01-10 MED ORDER — VITAMIN D 25 MCG (1000 UNIT) PO TABS
1000.0000 [IU] | ORAL_TABLET | Freq: Every day | ORAL | Status: DC
  Administered 2020-01-10 – 2020-01-14 (×5): 1000 [IU] via ORAL
  Filled 2020-01-10 (×5): qty 1

## 2020-01-10 MED ORDER — ALBUTEROL SULFATE HFA 108 (90 BASE) MCG/ACT IN AERS
2.0000 | INHALATION_SPRAY | Freq: Three times a day (TID) | RESPIRATORY_TRACT | Status: DC
  Administered 2020-01-11 – 2020-01-13 (×7): 2 via RESPIRATORY_TRACT
  Filled 2020-01-10: qty 6.7

## 2020-01-10 NOTE — Plan of Care (Signed)

## 2020-01-10 NOTE — Progress Notes (Signed)
Inpatient Diabetes Program Recommendations  AACE/ADA: New Consensus Statement on Inpatient Glycemic Control (2015)  Target Ranges:  Prepandial:   less than 140 mg/dL      Peak postprandial:   less than 180 mg/dL (1-2 hours)      Critically ill patients:  140 - 180 mg/dL   Lab Results  Component Value Date   GLUCAP 329 (H) 01/10/2020   HGBA1C 8.2 (H) 01/09/2020    Review of Glycemic Control Results for Earl Francis, Earl Francis (MRN [id number]) as of 01/10/2020 15:21  Ref. Range 01/09/2020 16:36 01/09/2020 21:16 01/10/2020 08:04 01/10/2020 11:41  Glucose-Capillary Latest Ref Range: 70 - 99 mg/dL 672 (H) 091 (H) 980 (H) 329 (H)   Diabetes history: DM 2 Outpatient Diabetes medications:  Metformin 1000 mg daily Current orders for Inpatient glycemic control:  Novolog sensitive tid with meals Decadron 6 mg daily Inpatient Diabetes Program Recommendations:    Consider adding Levemir 15 units bid and Novolog 6 units tid with meals.  Notified MD.   Thanks  Beryl Meager, RN, BC-ADM Inpatient Diabetes Coordinator Pager [phone] (8a-5p)

## 2020-01-10 NOTE — Progress Notes (Signed)
PROGRESS NOTE  Earl Francis XTK:[id number] DOB: 1961/06/03 DOA: 01/09/2020 PCP: System, Pcp Not In  HPI/Recap of past 24 hours:  Earl Francis is a 59 y.o. male with medical history significant of IDDM, HTN, presented with increasing short of breath and hypoxia for 8 days.  Covid positive as an outpatient 4 days prior to presentation.     Associated with nonproductive cough, fatigue and diarrhea. States he has trouble getting up and moving around.    On the day of presentation his breathing became worse and decided to come to the ED for further evaluation.  In-house COVID-19 test positive on 01/09/2020.  Chest x-ray bilateral pulmonary infiltrates worse at bases.  Procalcitonin negative.  TRH was asked to admit.  Started on COVID-19 directed therapies.   01/10/20: Seen and examined.  Reports dyspnea with minimal exertion.  Still requiring 4 L nasal cannula to maintain O2 saturation greater than 90%.  Admits to persistent nonproductive cough.  Assessment/Plan: Active Problems:   COVID-19 virus infection   COVID-19  COVID-19 viral pneumonia Presented with dyspnea and hypoxia In-house positive COVID-19 test done on 01/09/2020. Currently requiring 4 L to maintain O2 saturation greater than 90% Independently viewed chest x-ray done on admission which showed bilateral pulmonary infiltrates worse at bases. Started on COVID-19 directed therapies Day number 2 out of 5 of remdesivir Day number 2 out of 10 of Decadron Continue bronchodilators albuterol inhaler 2 puffs every 6 hours and ipratropium inhaler 2 puffs every 6 hours Add incentive spirometer and flutter valve Continue vitamin C, zinc and D3 Continue DVT prophylaxis Trend inflammatory markers daily Maintain O2 saturation greater than 92% Mobilize with assistance and fall precautions  Acute hypoxic respiratory failure secondary to COVID-19 viral pneumonia Not on oxygen supplementation at baseline Currently requiring 4 L to maintain O2  saturation greater than 90% Rest of management as stated above.  Type 2 diabetes with hyperglycemia Hemoglobin A1c 8.2 Hyperglycemia likely exacerbated by steroids Diabetes coordinator assisting. Levemir 10 units twice daily and NovoLog 6 units 3 times daily. Continue insulin sliding scale  Hyperlipidemia Resume home medication  Essential hypertension Blood pressure stable Resume home medications  BPH Resume home medication  Alcohol use disorder No evidence of alcohol withdrawal Continue multivitamin, folic acid and thiamine.   DVT prophylaxis: Lovenox subcu daily Code Status: Full code Family Communication: None at bedside   Disposition Plan:  Patient is from home.  Anticipate discharge to home once oxygen saturation is close to baseline.  Barrier to discharge persistent hypoxia and dyspnea, ongoing COVID-19 directed therapies.   Consults called: None   Objective: Vitals:   01/10/20 0048 01/10/20 0425 01/10/20 1015 01/10/20 1230  BP: 113/72 115/73  138/84  Pulse: 77 76 84 85  Resp: (!) 24  18 17   Temp: 98 F (36.7 C) 97.7 F (36.5 C)  97.9 F (36.6 C)  TempSrc: Oral Oral  Oral  SpO2: 94% 93% 95% 95%  Weight:      Height:        Intake/Output Summary (Last 24 hours) at 01/10/2020 1501 Last data filed at 01/10/2020 1200 Gross per 24 hour  Intake 1505 ml  Output 1425 ml  Net 80 ml   Filed Weights   01/09/20 1013  Weight: 106.6 kg    Exam:  . General: 59 y.o. year-old male well developed well nourished in no acute distress.  Alert and oriented x3. . Cardiovascular: Regular rate and rhythm with no rubs or gallops.  No thyromegaly or JVD noted.   59  Respiratory: Diffuse rales bilaterally.  Poor inspiratory effort. . Abdomen: Soft nontender nondistended with normal bowel sounds x4 quadrants. . Musculoskeletal: No lower extremity edema. 2/4 pulses in all 4 extremities. Marland Kitchen Psychiatry: Mood is appropriate for condition and setting   Data  Reviewed: CBC: Recent Labs  Lab 01/09/20 1015 01/10/20 0525  WBC 5.1 4.3  NEUTROABS 3.4 2.7  HGB 14.9 14.4  HCT 44.0 40.6  MCV 94.2 90.4  PLT 144* 167   Basic Metabolic Panel: Recent Labs  Lab 01/09/20 1015 01/09/20 1540 01/10/20 0525  NA 132*  --  132*  K 4.1  --  4.4  CL 98  --  101  CO2 24  --  19*  GLUCOSE 268*  --  336*  BUN 21*  --  27*  CREATININE 1.14  --  0.99  CALCIUM 8.3*  --  8.2*  MG  --  1.9 2.0  PHOS  --  2.7 3.1   GFR: Estimated Creatinine Clearance: 103.5 mL/min (by C-G formula based on SCr of 0.99 mg/dL). Liver Function Tests: Recent Labs  Lab 01/09/20 1015 01/10/20 0525  AST 43* 38  ALT 48* 49*  ALKPHOS 42 43  BILITOT 0.6 0.7  PROT 6.9 6.7  ALBUMIN 3.2* 3.0*   No results for input(s): LIPASE, AMYLASE in the last 168 hours. No results for input(s): AMMONIA in the last 168 hours. Coagulation Profile: No results for input(s): INR, PROTIME in the last 168 hours. Cardiac Enzymes: No results for input(s): CKTOTAL, CKMB, CKMBINDEX, TROPONINI in the last 168 hours. BNP (last 3 results) No results for input(s): PROBNP in the last 8760 hours. HbA1C: Recent Labs    01/09/20 1235  HGBA1C 8.2*   CBG: Recent Labs  Lab 01/09/20 1636 01/09/20 2116 01/10/20 0804 01/10/20 1141  GLUCAP 292* 385* 303* 329*   Lipid Profile: Recent Labs    01/09/20 0944  TRIG 211*   Thyroid Function Tests: No results for input(s): TSH, T4TOTAL, FREET4, T3FREE, THYROIDAB in the last 72 hours. Anemia Panel: Recent Labs    01/09/20 1015 01/10/20 0525  FERRITIN 1,246* 1,424*   Urine analysis: No results found for: COLORURINE, APPEARANCEUR, LABSPEC, PHURINE, GLUCOSEU, HGBUR, BILIRUBINUR, KETONESUR, PROTEINUR, UROBILINOGEN, NITRITE, LEUKOCYTESUR Sepsis Labs: @LABRCNTIP (procalcitonin:4,lacticidven:4)  ) Recent Results (from the past 240 hour(s))  Blood Culture (routine x 2)     Status: None (Preliminary result)   Collection Time: 01/09/20 10:15 AM    Specimen: BLOOD  Result Value Ref Range Status   Specimen Description BLOOD RIGHT ANTECUBITAL  Final   Special Requests   Final    BOTTLES DRAWN AEROBIC AND ANAEROBIC Blood Culture adequate volume   Culture   Final    NO GROWTH < 24 HOURS Performed at [facility], [address], [place], [place]    Report Status PENDING  Incomplete  Blood Culture (routine x 2)     Status: None (Preliminary result)   Collection Time: 01/09/20  2:55 PM   Specimen: BLOOD  Result Value Ref Range Status   Specimen Description BLOOD LEFT ANTECUBITAL  Final   Special Requests   Final    BOTTLES DRAWN AEROBIC AND ANAEROBIC Blood Culture adequate volume   Culture   Final    NO GROWTH < 24 HOURS Performed at [facility], [address] [place], [place]    Report Status PENDING  Incomplete      Studies: No results found.  Scheduled Meds: . albuterol  2 puff Inhalation  Q6H  . vitamin C  500 mg Oral Daily  . cholecalciferol  1,000 Units Oral Daily  . dexamethasone  6 mg Oral Daily  . enoxaparin (LOVENOX) injection  55 mg Subcutaneous Q24H  . folic acid  1 mg Oral Daily  . insulin aspart  0-9 Units Subcutaneous TID WC  . metFORMIN  500 mg Oral BID WC  . multivitamin with minerals  1 tablet Oral Daily  . thiamine  100 mg Oral Daily   Or  . thiamine  100 mg Intravenous Daily  . zinc sulfate  220 mg Oral Daily    Continuous Infusions: . remdesivir 100 mg in NS 100 mL 100 mg (01/10/20 0846)     LOS: 0 days     Kayleen Memos, MD [facility] Pager [phone]  If 7PM-7AM, please contact night-coverage [email or web address] Password [facility] 01/10/2020, 3:01 PM

## 2020-01-11 DIAGNOSIS — J9601 Acute respiratory failure with hypoxia: Secondary | ICD-10-CM

## 2020-01-11 DIAGNOSIS — U071 COVID-19: Principal | ICD-10-CM

## 2020-01-11 DIAGNOSIS — E1165 Type 2 diabetes mellitus with hyperglycemia: Secondary | ICD-10-CM

## 2020-01-11 LAB — CBC WITH DIFFERENTIAL/PLATELET
Abs Immature Granulocytes: 0.03 10*3/uL (ref 0.00–0.07)
Basophils Absolute: 0 10*3/uL (ref 0.0–0.1)
Basophils Relative: 0 %
Eosinophils Absolute: 0 10*3/uL (ref 0.0–0.5)
Eosinophils Relative: 0 %
HCT: 37.9 % — ABNORMAL LOW (ref 39.0–52.0)
Hemoglobin: 13.2 g/dL (ref 13.0–17.0)
Immature Granulocytes: 1 %
Lymphocytes Relative: 23 %
Lymphs Abs: 1.3 10*3/uL (ref 0.7–4.0)
MCH: 31.9 pg (ref 26.0–34.0)
MCHC: 34.8 g/dL (ref 30.0–36.0)
MCV: 91.5 fL (ref 80.0–100.0)
Monocytes Absolute: 0.7 10*3/uL (ref 0.1–1.0)
Monocytes Relative: 13 %
Neutro Abs: 3.6 10*3/uL (ref 1.7–7.7)
Neutrophils Relative %: 63 %
Platelets: 176 10*3/uL (ref 150–400)
RBC: 4.14 MIL/uL — ABNORMAL LOW (ref 4.22–5.81)
RDW: 11.9 % (ref 11.5–15.5)
WBC: 5.6 10*3/uL (ref 4.0–10.5)
nRBC: 0 % (ref 0.0–0.2)

## 2020-01-11 LAB — MAGNESIUM: Magnesium: 2.3 mg/dL (ref 1.7–2.4)

## 2020-01-11 LAB — GLUCOSE, CAPILLARY
Glucose-Capillary: 259 mg/dL — ABNORMAL HIGH (ref 70–99)
Glucose-Capillary: 304 mg/dL — ABNORMAL HIGH (ref 70–99)
Glucose-Capillary: 390 mg/dL — ABNORMAL HIGH (ref 70–99)
Glucose-Capillary: 355 mg/dL — ABNORMAL HIGH (ref 70–99)

## 2020-01-11 LAB — COMPREHENSIVE METABOLIC PANEL
ALT: 45 U/L — ABNORMAL HIGH (ref 0–44)
AST: 32 U/L (ref 15–41)
Albumin: 2.6 g/dL — ABNORMAL LOW (ref 3.5–5.0)
Alkaline Phosphatase: 41 U/L (ref 38–126)
Anion gap: 11 (ref 5–15)
BUN: 28 mg/dL — ABNORMAL HIGH (ref 6–20)
CO2: 22 mmol/L (ref 22–32)
Calcium: 8.3 mg/dL — ABNORMAL LOW (ref 8.9–10.3)
Chloride: 98 mmol/L (ref 98–111)
Creatinine, Ser: 0.94 mg/dL (ref 0.61–1.24)
GFR calc Af Amer: >60 mL/min (ref >60)
GFR calc non Af Amer: >60 mL/min (ref >60)
Glucose, Bld: 286 mg/dL — ABNORMAL HIGH (ref 70–99)
Potassium: 4 mmol/L (ref 3.5–5.1)
Sodium: 131 mmol/L — ABNORMAL LOW (ref 135–145)
Total Bilirubin: 0.6 mg/dL (ref 0.3–1.2)
Total Protein: 6.2 g/dL — ABNORMAL LOW (ref 6.5–8.1)

## 2020-01-11 LAB — FERRITIN: Ferritin: 1264 ng/mL — ABNORMAL HIGH (ref 24–336)

## 2020-01-11 LAB — PHOSPHORUS: Phosphorus: 4 mg/dL (ref 2.5–4.6)

## 2020-01-11 LAB — C-REACTIVE PROTEIN: CRP: 0.9 mg/dL (ref <1.0)

## 2020-01-11 LAB — D-DIMER, QUANTITATIVE: D-Dimer, Quant: 0.36 ug/mL-FEU (ref 0.00–0.50)

## 2020-01-11 MED ORDER — LINAGLIPTIN 5 MG PO TABS
5.0000 mg | ORAL_TABLET | Freq: Every day | ORAL | Status: DC
  Administered 2020-01-11 – 2020-01-14 (×4): 5 mg via ORAL
  Filled 2020-01-11 (×4): qty 1

## 2020-01-11 MED ORDER — INSULIN DETEMIR 100 UNIT/ML ~~LOC~~ SOLN
12.0000 [IU] | Freq: Two times a day (BID) | SUBCUTANEOUS | Status: DC
  Administered 2020-01-11 – 2020-01-14 (×7): 12 [IU] via SUBCUTANEOUS
  Filled 2020-01-11 (×9): qty 0.12

## 2020-01-11 MED ORDER — FLUTICASONE PROPIONATE 50 MCG/ACT NA SUSP
1.0000 | Freq: Every day | NASAL | Status: DC
  Administered 2020-01-11 – 2020-01-14 (×4): 1 via NASAL
  Filled 2020-01-11: qty 16

## 2020-01-11 MED ORDER — ROSUVASTATIN CALCIUM 5 MG PO TABS
10.0000 mg | ORAL_TABLET | Freq: Every day | ORAL | Status: DC
  Administered 2020-01-11 – 2020-01-14 (×4): 10 mg via ORAL
  Filled 2020-01-11 (×4): qty 2

## 2020-01-11 MED ORDER — LORATADINE 10 MG PO TABS
10.0000 mg | ORAL_TABLET | Freq: Every day | ORAL | Status: DC
  Administered 2020-01-11 – 2020-01-14 (×4): 10 mg via ORAL
  Filled 2020-01-11 (×4): qty 1

## 2020-01-11 MED ORDER — FINASTERIDE 5 MG PO TABS
5.0000 mg | ORAL_TABLET | Freq: Every day | ORAL | Status: DC
  Administered 2020-01-11 – 2020-01-14 (×4): 5 mg via ORAL
  Filled 2020-01-11 (×4): qty 1

## 2020-01-11 NOTE — Plan of Care (Signed)

## 2020-01-11 NOTE — Progress Notes (Addendum)
PROGRESS NOTE    Earl Francis  GUY:[id number] DOB: 02-08-1961 DOA: 01/09/2020 PCP: System, Pcp Not In   Brief Narrative:  Earl Francis a 59 y.o.malewith medical history significant oftype 2 DM, HTN, presented withincreasing short of breath and hypoxia for 8 days.  He tested Covid +4 days prior to admission which was 28 January.  In-house COVID-19 test positive on 01/09/2020.  Chest x-ray bilateral pulmonary infiltrates worse at bases.  Procalcitonin negative.  TRH was asked to admit.  Started on COVID-19 directed therapies.  1/30- Able to wean to room air when sitting. Still has considerable dyspnea with exertion. Wife anxious about return home.   Assessment & Plan:   Active Problems:   COVID-19 virus infection   COVID-19   Type 2 diabetes mellitus with hyperglycemia (HCC)  Acute hypoxemic respiratory failure secondary to COVID-19 pneumonia, slowly improving Weaning oxygen to >92% Day number 3 out of 5 of remdesivir Day number 3 out of 10 of Decadron Continue bronchodilators albuterol inhaler 2 puffs every 6 hours and ipratropium inhaler 2 puffs every 6 hours Incentive spirometer and flutter valve Continue vitamin C, zinc and D3 Continue DVT prophylaxis Trend inflammatory markers daily Mobilize with assistance and fall precautions  Type 2 diabetes with hyperglycemia Hemoglobin A1c 8.2 Hyperglycemia likely exacerbated by steroids Diabetes coordinator assisting. Levemir 12 units twice daily and NovoLog 6 units 3 times daily. Continue insulin sliding scale Added linagliptin  Wife and patient about needing insulin on discharge---can likely add glipizide to home regimen for steroid induced hyperglycemia   Hyperlipidemia Resume home medication  Essential hypertension Blood pressure stable Resume home medications  BPH Resume home medication  Alcohol use disorder No evidence of alcohol withdrawal Continue multivitamin, folic acid and thiamine.  Hyponatremia,   Secondary to hyperglycemia   HTN - Holding ACE, amlodipine, metoprolol--restart as able  HLD - Restarted statin  BPH - Restarted finasteride    DVT prophylaxis: Enoxaparin   Code Status: FULL  Family Communication: Called and discussed with wife  Disposition Plan: Home when completes remdesivir (likely Monday)     Consultants:   None  Procedures:   None  Antimicrobials:   Remdesivir     Subjective:  Patient reports he continues to experience significant dyspnea.  Endorses shortness of breath with minimal movement.  He is very anxious about the possibility of needing insulin at home, already on oxygen at home.  Objective: Vitals:   01/11/20 0400 01/11/20 0405 01/11/20 1403 01/11/20 1411  BP: 112/83     Pulse: (!) 53     Resp: 17     Temp: 97.9 F (36.6 C)     TempSrc: Oral Other (Comment)    SpO2: 92%  95% 96%  Weight:      Height:        Intake/Output Summary (Last 24 hours) at 01/11/2020 1631 Last data filed at 01/11/2020 0904 Gross per 24 hour  Intake 320 ml  Output 1325 ml  Net -1005 ml   Filed Weights   01/09/20 1013  Weight: 106.6 kg    Examination:  General exam: Talkative but appropriate Respiratory system: Bilateral coarse crackles with poor effort, tachypneic. Cardiovascular system: S1 & S2 heard, RRR. No JVD, murmurs, rubs, gallops or clicks. Gastrointestinal system: Abdomen is nondistended, soft and nontender. No organomegaly or masses felt. Normal bowel sounds heard. Central nervous system: Alert and oriented. No focal neurological deficits. Extremities: Symmetric 5 x 5 power. Skin: No rashes, lesions or ulcers Psychiatry: Judgement and insight appear normal. Mood &  affect appropriate.   Data Reviewed: I have personally reviewed following labs and imaging studies  CBC: Recent Labs  Lab 01/09/20 1015 01/10/20 0525 01/11/20 0500  WBC 5.1 4.3 5.6  NEUTROABS 3.4 2.7 3.6  HGB 14.9 14.4 13.2  HCT 44.0 40.6 37.9*  MCV 94.2 90.4  91.5  PLT 144* 167 706   Basic Metabolic Panel: Recent Labs  Lab 01/09/20 1015 01/09/20 1540 01/10/20 0525 01/11/20 0500  NA 132*  --  132* 131*  K 4.1  --  4.4 4.0  CL 98  --  101 98  CO2 24  --  19* 22  GLUCOSE 268*  --  336* 286*  BUN 21*  --  27* 28*  CREATININE 1.14  --  0.99 0.94  CALCIUM 8.3*  --  8.2* 8.3*  MG  --  1.9 2.0 2.3  PHOS  --  2.7 3.1 4.0  Liver Function Tests: Recent Labs  Lab 01/09/20 1015 01/10/20 0525 01/11/20 0500  AST 43* 38 32  ALT 48* 49* 45*  ALKPHOS 42 43 41  BILITOT 0.6 0.7 0.6  PROT 6.9 6.7 6.2*  ALBUMIN 3.2* 3.0* 2.6*   HbA1C: Recent Labs    01/09/20 1235  HGBA1C 8.2*   CBG: Recent Labs  Lab 01/10/20 1141 01/10/20 1703 01/10/20 2107 01/11/20 0725 01/11/20 1152  GLUCAP 329* 324* 353* 259* 304*   Lipid Profile: Recent Labs    01/09/20 0944  TRIG 211*  Anemia Panel: Recent Labs    01/10/20 0525 01/11/20 0500  FERRITIN 1,424* 1,264*   Sepsis Labs: Recent Labs  Lab 01/09/20 1015  PROCALCITON <0.10  LATICACIDVEN 1.5   Blood cultures no growth today     Scheduled Meds: . albuterol  2 puff Inhalation TID  . vitamin C  500 mg Oral Daily  . chlorpheniramine-HYDROcodone  5 mL Oral Q12H  . cholecalciferol  1,000 Units Oral Daily  . dexamethasone (DECADRON) injection  6 mg Intravenous Q24H  . enoxaparin (LOVENOX) injection  55 mg Subcutaneous Q24H  . fluticasone  1 spray Each Nare Daily  . folic acid  1 mg Oral Daily  . insulin aspart  0-9 Units Subcutaneous TID WC  . insulin aspart  6 Units Subcutaneous TID WC  . insulin detemir  12 Units Subcutaneous BID  . ipratropium  2 puff Inhalation TID  . linagliptin  5 mg Oral Daily  . loratadine  10 mg Oral Daily  . multivitamin with minerals  1 tablet Oral Daily  . thiamine  100 mg Oral Daily   Or  . thiamine  100 mg Intravenous Daily  . zinc sulfate  220 mg Oral Daily   Continuous Infusions: . remdesivir 100 mg in NS 100 mL Stopped (01/11/20 1042)     LOS:  1 day    Time spent: 25 minutes     Dorris Singh, MD   [facility] Pager [phone]  If 7PM-7AM, please contact night-coverage [email or web address] Password [facility] 01/11/2020, 4:31 PM

## 2020-01-11 NOTE — Evaluation (Signed)
Physical Therapy Evaluation Patient Details Name: Earl Francis MRN: [id number] DOB: 07/30/1961 Today's Date: 01/11/2020   History of Present Illness  59 y.o. male with medical history significant of IDDM, HTN, presented with increasing short of breath and hypoxia. COVID+    Clinical Impression  PT eval complete. Pt independent bed mobility and transfers. Supervision provided for hallway ambulation. Steady gait noted. No LOB or physical assist needed. No further PT services indicated. PT signing off.  SATURATION QUALIFICATIONS: (This note is used to comply with regulatory documentation for home oxygen)  Patient Saturations on Room Air at Rest = 96%  Patient Saturations on Room Air while Ambulating = 96%  Patient Saturations on 2 Liters of oxygen while Ambulating = 96%     Follow Up Recommendations No PT follow up;Supervision - Intermittent    Equipment Recommendations  None recommended by PT    Recommendations for Other Services       Precautions / Restrictions Precautions Precautions: None      Mobility  Bed Mobility Overal bed mobility: Independent                Transfers Overall transfer level: Independent Equipment used: None                Ambulation/Gait Ambulation/Gait assistance: Supervision Gait Distance (Feet): 160 Feet Assistive device: None Gait Pattern/deviations: WFL(Within Functional Limits) Gait velocity: decreased Gait velocity interpretation: 1.31 - 2.62 ft/sec, indicative of limited community ambulator General Gait Details: steady gait. Ambulated 160' on 2L with SpO2 96%. Ambulated 2nd trial in room on RA 50' SpO2 96%.  Stairs            Wheelchair Mobility    Modified Rankin (Stroke Patients Only)       Balance Overall balance assessment: No apparent balance deficits (not formally assessed)                                           Pertinent Vitals/Pain Pain Assessment: No/denies pain    Home  Living Family/patient expects to be discharged to:: Private residence Living Arrangements: Spouse/significant other;Children Available Help at Discharge: Family;Available 24 hours/day Type of Home: House Home Access: Stairs to enter Entrance Stairs-Rails: None Entrance Stairs-Number of Steps: 2 Home Layout: Two level;Bed/bath upstairs Home Equipment: None      Prior Function Level of Independence: Independent               Hand Dominance        Extremity/Trunk Assessment   Upper Extremity Assessment Upper Extremity Assessment: Overall WFL for tasks assessed    Lower Extremity Assessment Lower Extremity Assessment: Overall WFL for tasks assessed    Cervical / Trunk Assessment Cervical / Trunk Assessment: Normal  Communication   Communication: No difficulties  Cognition Arousal/Alertness: Awake/alert Behavior During Therapy: WFL for tasks assessed/performed Overall Cognitive Status: Within Functional Limits for tasks assessed                                        General Comments General comments (skin integrity, edema, etc.): SpO2 96% on RA at end of session at rest in recliner.    Exercises     Assessment/Plan    PT Assessment Patent does not need any further PT services  PT Problem List Decreased mobility;Decreased activity  tolerance       PT Treatment Interventions Gait training    PT Goals (Current goals can be found in the Care Plan section)  Acute Rehab PT Goals Patient Stated Goal: breathe better PT Goal Formulation: All assessment and education complete, DC therapy    Frequency     Barriers to discharge        Co-evaluation               AM-PAC PT "6 Clicks" Mobility  Outcome Measure Help needed turning from your back to your side while in a flat bed without using bedrails?: None Help needed moving from lying on your back to sitting on the side of a flat bed without using bedrails?: None Help needed moving to  and from a bed to a chair (including a wheelchair)?: None Help needed standing up from a chair using your arms (e.g., wheelchair or bedside chair)?: None Help needed to walk in hospital room?: None Help needed climbing 3-5 steps with a railing? : A Little 6 Click Score: 23    End of Session Equipment Utilized During Treatment: Oxygen Activity Tolerance: Patient tolerated treatment well Patient left: in chair;with call bell/phone within reach Nurse Communication: Mobility status PT Visit Diagnosis: Difficulty in walking, not elsewhere classified (R26.2)    Time: 3154-0086 PT Time Calculation (min) (ACUTE ONLY): 29 min   Charges:   PT Evaluation $PT Eval Low Complexity: 1 Low PT Treatments $Gait Training: 8-22 mins        Lorrin Goodell, PT  Office # [phone] Pager [phone]   Lorriane Shire 01/11/2020, 2:44 PM

## 2020-01-11 NOTE — Care Management (Signed)
Notified by nurse that patient's wife was calling to discuss DC plans requesting a callback. Spoke w patient who asked that we defer calling her until closer to DC, and coordinate DC plan with him until then.

## 2020-01-12 DIAGNOSIS — I10 Essential (primary) hypertension: Secondary | ICD-10-CM

## 2020-01-12 DIAGNOSIS — E785 Hyperlipidemia, unspecified: Secondary | ICD-10-CM

## 2020-01-12 LAB — CBC WITH DIFFERENTIAL/PLATELET
Abs Immature Granulocytes: 0.02 10*3/uL (ref 0.00–0.07)
Basophils Absolute: 0 10*3/uL (ref 0.0–0.1)
Basophils Relative: 0 %
Eosinophils Absolute: 0 10*3/uL (ref 0.0–0.5)
Eosinophils Relative: 0 %
HCT: 39 % (ref 39.0–52.0)
Hemoglobin: 13.5 g/dL (ref 13.0–17.0)
Immature Granulocytes: 0 %
Lymphocytes Relative: 23 %
Lymphs Abs: 1.3 10*3/uL (ref 0.7–4.0)
MCH: 31.8 pg (ref 26.0–34.0)
MCHC: 34.6 g/dL (ref 30.0–36.0)
MCV: 92 fL (ref 80.0–100.0)
Monocytes Absolute: 0.7 10*3/uL (ref 0.1–1.0)
Monocytes Relative: 12 %
Neutro Abs: 3.6 10*3/uL (ref 1.7–7.7)
Neutrophils Relative %: 65 %
Platelets: 195 10*3/uL (ref 150–400)
RBC: 4.24 MIL/uL (ref 4.22–5.81)
RDW: 12 % (ref 11.5–15.5)
WBC: 5.7 10*3/uL (ref 4.0–10.5)
nRBC: 0 % (ref 0.0–0.2)

## 2020-01-12 LAB — PHOSPHORUS: Phosphorus: 3.9 mg/dL (ref 2.5–4.6)

## 2020-01-12 LAB — COMPREHENSIVE METABOLIC PANEL
ALT: 44 U/L (ref 0–44)
AST: 33 U/L (ref 15–41)
Albumin: 2.8 g/dL — ABNORMAL LOW (ref 3.5–5.0)
Alkaline Phosphatase: 40 U/L (ref 38–126)
Anion gap: 11 (ref 5–15)
BUN: 21 mg/dL — ABNORMAL HIGH (ref 6–20)
CO2: 22 mmol/L (ref 22–32)
Calcium: 8.4 mg/dL — ABNORMAL LOW (ref 8.9–10.3)
Chloride: 101 mmol/L (ref 98–111)
Creatinine, Ser: 0.82 mg/dL (ref 0.61–1.24)
GFR calc Af Amer: >60 mL/min (ref >60)
GFR calc non Af Amer: >60 mL/min (ref >60)
Glucose, Bld: 220 mg/dL — ABNORMAL HIGH (ref 70–99)
Potassium: 4.1 mmol/L (ref 3.5–5.1)
Sodium: 134 mmol/L — ABNORMAL LOW (ref 135–145)
Total Bilirubin: 0.7 mg/dL (ref 0.3–1.2)
Total Protein: 6.2 g/dL — ABNORMAL LOW (ref 6.5–8.1)

## 2020-01-12 LAB — FERRITIN: Ferritin: 1057 ng/mL — ABNORMAL HIGH (ref 24–336)

## 2020-01-12 LAB — GLUCOSE, CAPILLARY
Glucose-Capillary: 216 mg/dL — ABNORMAL HIGH (ref 70–99)
Glucose-Capillary: 236 mg/dL — ABNORMAL HIGH (ref 70–99)
Glucose-Capillary: 357 mg/dL — ABNORMAL HIGH (ref 70–99)
Glucose-Capillary: 323 mg/dL — ABNORMAL HIGH (ref 70–99)

## 2020-01-12 LAB — D-DIMER, QUANTITATIVE: D-Dimer, Quant: 0.35 ug/mL-FEU (ref 0.00–0.50)

## 2020-01-12 LAB — C-REACTIVE PROTEIN: CRP: 0.6 mg/dL (ref <1.0)

## 2020-01-12 LAB — MAGNESIUM: Magnesium: 2.3 mg/dL (ref 1.7–2.4)

## 2020-01-12 NOTE — Progress Notes (Signed)
PROGRESS NOTE    Earl Francis  [id number]:[id number] DOB: 1961/02/04 DOA: 01/09/2020 PCP: System, Pcp Not In    Brief Narrative:  Earl Francis a 59 y.o.malewith medical history significant oftype 2 DM, HTN, presented withincreasing short of breath and hypoxia for 8 days.  He tested Covid +4 days prior to admission which was 28 January.In-house COVID-19 test positive on 01/09/2020. Chest x-ray bilateral pulmonary infiltrates worse at bases. Procalcitonin negative. TRHwas asked to admit. Started on COVID-19 directed therapies.  1/30- Able to wean to room air when sitting. Still has considerable dyspnea with exertion. Wife anxious about return home.   Assessment & Plan:   Active Problems:   COVID-19 virus infection   COVID-19   Type 2 diabetes mellitus with hyperglycemia (HCC)  Acute hypoxemic respiratory failure secondary to COVID-19 pneumonia, slowly improving Weaning oxygen to >92% Day number 4 out of 5 of remdesivir Day number 4 out of 10 of Decadron Continue bronchodilators albuterol inhaler 2 puffs every 6 hours and ipratropium inhaler 2 puffs every 6 hours Incentive spirometer and flutter valve Continue vitamin C, zinc and D3 Continue DVT prophylaxis Trend inflammatory markers daily Mobilize with assistance and fall precautions  Type 2 diabetes withhyperglycemia Hemoglobin A1c 8.2 Hyperglycemia likely exacerbated by steroids Diabetes coordinator assisting. Levemir 12 units twice daily and NovoLog 6 units 3 times daily. Continue insulin sliding scale Added linagliptin  Wife and patient about needing insulin on discharge---can likely add glipizide to home regimen for steroid induced hyperglycemia vs. Awaiting subsiding steroid effect and continue metformin.  Hyperlipidemia Resume home medication  Essential hypertension Blood pressure stable Resume home medications  BPH Resume home medication  Alcohol use disorder No evidence of alcohol withdrawal  Continue multivitamin, folic acid and thiamine.  Hyponatremia,  Secondary to hyperglycemia   HTN - Holding ACE, amlodipine, metoprolol--restart as able  HLD - Restarted statin  BPH - Restarted finasteride  DVT prophylaxis: Lovenox SQ Code Status: Full code  Family Communication: Patient Disposition Plan: Home 2/1 likely   Consultants:   None  Procedures:  None  Antimicrobials: Anti-infectives (From admission, onward)   Start     Dose/Rate Route Frequency Ordered Stop   01/10/20 1000  remdesivir 100 mg in sodium chloride 0.9 % 100 mL IVPB     100 mg 200 mL/hr over 30 Minutes Intravenous Daily 01/09/20 1239 01/14/20 0959   01/09/20 1330  remdesivir 200 mg in sodium chloride 0.9% 250 mL IVPB     200 mg 580 mL/hr over 30 Minutes Intravenous Once 01/09/20 1239 01/10/20 0811       Subjective: Still with some SOB and fatigue.  Objective: Vitals:   01/12/20 0300 01/12/20 0500 01/12/20 0613 01/12/20 1314  BP:   124/72 135/84  Pulse: (!) 53 60 (!) 58 81  Resp: 15  18 20   Temp:   97.8 F (36.6 C) 98 F (36.7 C)  TempSrc:   Oral Oral  SpO2: 94% 96% 97% 96%  Weight: 103.1 kg     Height:        Intake/Output Summary (Last 24 hours) at 01/12/2020 1438 Last data filed at 01/12/2020 1341 Gross per 24 hour  Intake 1077 ml  Output 900 ml  Net 177 ml   Filed Weights   01/09/20 1013 01/12/20 0300  Weight: 106.6 kg 103.1 kg    Examination:  General exam: Appears calm and comfortable  Respiratory system: dyspneic with speaking, good air movement, few rales. Cardiovascular system: S1 & S2 heard, RRR.  Gastrointestinal system: Abdomen  is nondistended, soft and nontender.  Central nervous system: Alert and oriented. No focal neurological deficits. Extremities: Symmetric  Skin: No rashes Psychiatry: Judgement and insight appear normal. Mood & affect appropriate.     Data Reviewed: I have personally reviewed following labs and imaging studies  CBC: Recent  Labs  Lab 01/09/20 1015 01/10/20 0525 01/11/20 0500 01/12/20 0702  WBC 5.1 4.3 5.6 5.7  NEUTROABS 3.4 2.7 3.6 3.6  HGB 14.9 14.4 13.2 13.5  HCT 44.0 40.6 37.9* 39.0  MCV 94.2 90.4 91.5 92.0  PLT 144* 167 176 195   Basic Metabolic Panel: Recent Labs  Lab 01/09/20 1015 01/09/20 1540 01/10/20 0525 01/11/20 0500 01/12/20 0702  NA 132*  --  132* 131* 134*  K 4.1  --  4.4 4.0 4.1  CL 98  --  101 98 101  CO2 24  --  19* 22 22  GLUCOSE 268*  --  336* 286* 220*  BUN 21*  --  27* 28* 21*  CREATININE 1.14  --  0.99 0.94 0.82  CALCIUM 8.3*  --  8.2* 8.3* 8.4*  MG  --  1.9 2.0 2.3 2.3  PHOS  --  2.7 3.1 4.0 3.9   GFR: Estimated Creatinine Clearance: 122.9 mL/min (by C-G formula based on SCr of 0.82 mg/dL). Liver Function Tests: Recent Labs  Lab 01/09/20 1015 01/10/20 0525 01/11/20 0500 01/12/20 0702  AST 43* 38 32 33  ALT 48* 49* 45* 44  ALKPHOS 42 43 41 40  BILITOT 0.6 0.7 0.6 0.7  PROT 6.9 6.7 6.2* 6.2*  ALBUMIN 3.2* 3.0* 2.6* 2.8*   CBG: Recent Labs  Lab 01/11/20 1152 01/11/20 1701 01/11/20 2120 01/12/20 0822 01/12/20 1150  GLUCAP 304* 390* 355* 216* 236*   Anemia Panel: Recent Labs    01/11/20 0500 01/12/20 0702  FERRITIN 1,264* 1,057*   Sepsis Labs: Recent Labs  Lab 01/09/20 1015  PROCALCITON <0.10  LATICACIDVEN 1.5    Recent Results (from the past 240 hour(s))  Blood Culture (routine x 2)     Status: None (Preliminary result)   Collection Time: 01/09/20 10:15 AM   Specimen: BLOOD  Result Value Ref Range Status   Specimen Description BLOOD RIGHT ANTECUBITAL  Final   Special Requests   Final    BOTTLES DRAWN AEROBIC AND ANAEROBIC Blood Culture adequate volume   Culture   Final    NO GROWTH 2 DAYS Performed at [facility], [address] [place], [place] [address]    Report Status PENDING  Incomplete  Blood Culture (routine x 2)     Status: None (Preliminary result)   Collection Time: 01/09/20  2:55 PM   Specimen: BLOOD  Result  Value Ref Range Status   Specimen Description BLOOD LEFT ANTECUBITAL  Final   Special Requests   Final    BOTTLES DRAWN AEROBIC AND ANAEROBIC Blood Culture adequate volume   Culture   Final    NO GROWTH 2 DAYS Performed at [facility], [address] [place], [place] [address]    Report Status PENDING  Incomplete      Radiology Studies: No results found.   Scheduled Meds: . albuterol  2 puff Inhalation TID  . vitamin C  500 mg Oral Daily  . cholecalciferol  1,000 Units Oral Daily  . dexamethasone (DECADRON) injection  6 mg Intravenous Q24H  . enoxaparin (LOVENOX) injection  55 mg Subcutaneous Q24H  . finasteride  5 mg Oral Daily  . fluticasone  1 spray Each Nare Daily  . folic acid  1 mg Oral Daily  . insulin aspart  0-9 Units Subcutaneous TID WC  . insulin aspart  6 Units Subcutaneous TID WC  . insulin detemir  12 Units Subcutaneous BID  . ipratropium  2 puff Inhalation TID  . linagliptin  5 mg Oral Daily  . loratadine  10 mg Oral Daily  . multivitamin with minerals  1 tablet Oral Daily  . rosuvastatin  10 mg Oral Daily  . thiamine  100 mg Oral Daily  . zinc sulfate  220 mg Oral Daily   Continuous Infusions: . remdesivir 100 mg in NS 100 mL 100 mg (01/12/20 0912)     LOS: 2 days    Donnamae Jude, MD 01/12/2020 2:38 PM [phone] [facility] If 7PM-7AM, please contact night-coverage 01/12/2020, 2:38 PM

## 2020-01-12 NOTE — Plan of Care (Signed)

## 2020-01-13 DIAGNOSIS — I10 Essential (primary) hypertension: Secondary | ICD-10-CM

## 2020-01-13 DIAGNOSIS — E785 Hyperlipidemia, unspecified: Secondary | ICD-10-CM

## 2020-01-13 LAB — GLUCOSE, CAPILLARY
Glucose-Capillary: 181 mg/dL — ABNORMAL HIGH (ref 70–99)
Glucose-Capillary: 242 mg/dL — ABNORMAL HIGH (ref 70–99)
Glucose-Capillary: 392 mg/dL — ABNORMAL HIGH (ref 70–99)
Glucose-Capillary: 235 mg/dL — ABNORMAL HIGH (ref 70–99)

## 2020-01-13 LAB — CBC WITH DIFFERENTIAL/PLATELET
Abs Immature Granulocytes: 0.02 10*3/uL (ref 0.00–0.07)
Basophils Absolute: 0 10*3/uL (ref 0.0–0.1)
Basophils Relative: 0 %
Eosinophils Absolute: 0 10*3/uL (ref 0.0–0.5)
Eosinophils Relative: 0 %
HCT: 38.2 % — ABNORMAL LOW (ref 39.0–52.0)
Hemoglobin: 13.2 g/dL (ref 13.0–17.0)
Immature Granulocytes: 0 %
Lymphocytes Relative: 33 %
Lymphs Abs: 2 10*3/uL (ref 0.7–4.0)
MCH: 31.4 pg (ref 26.0–34.0)
MCHC: 34.6 g/dL (ref 30.0–36.0)
MCV: 91 fL (ref 80.0–100.0)
Monocytes Absolute: 0.8 10*3/uL (ref 0.1–1.0)
Monocytes Relative: 13 %
Neutro Abs: 3.1 10*3/uL (ref 1.7–7.7)
Neutrophils Relative %: 54 %
Platelets: 222 10*3/uL (ref 150–400)
RBC: 4.2 MIL/uL — ABNORMAL LOW (ref 4.22–5.81)
RDW: 11.9 % (ref 11.5–15.5)
WBC: 5.8 10*3/uL (ref 4.0–10.5)
nRBC: 0 % (ref 0.0–0.2)

## 2020-01-13 LAB — MAGNESIUM: Magnesium: 2.1 mg/dL (ref 1.7–2.4)

## 2020-01-13 LAB — COMPREHENSIVE METABOLIC PANEL
ALT: 53 U/L — ABNORMAL HIGH (ref 0–44)
AST: 39 U/L (ref 15–41)
Albumin: 2.7 g/dL — ABNORMAL LOW (ref 3.5–5.0)
Alkaline Phosphatase: 40 U/L (ref 38–126)
Anion gap: 12 (ref 5–15)
BUN: 19 mg/dL (ref 6–20)
CO2: 22 mmol/L (ref 22–32)
Calcium: 8.6 mg/dL — ABNORMAL LOW (ref 8.9–10.3)
Chloride: 101 mmol/L (ref 98–111)
Creatinine, Ser: 0.68 mg/dL (ref 0.61–1.24)
GFR calc Af Amer: >60 mL/min (ref >60)
GFR calc non Af Amer: >60 mL/min (ref >60)
Glucose, Bld: 225 mg/dL — ABNORMAL HIGH (ref 70–99)
Potassium: 3.9 mmol/L (ref 3.5–5.1)
Sodium: 135 mmol/L (ref 135–145)
Total Bilirubin: 0.8 mg/dL (ref 0.3–1.2)
Total Protein: 5.8 g/dL — ABNORMAL LOW (ref 6.5–8.1)

## 2020-01-13 LAB — PHOSPHORUS: Phosphorus: 3.4 mg/dL (ref 2.5–4.6)

## 2020-01-13 LAB — C-REACTIVE PROTEIN: CRP: 1 mg/dL — ABNORMAL HIGH (ref <1.0)

## 2020-01-13 LAB — D-DIMER, QUANTITATIVE: D-Dimer, Quant: <0.27 ug/mL-FEU (ref 0.00–0.50)

## 2020-01-13 LAB — FERRITIN: Ferritin: 1203 ng/mL — ABNORMAL HIGH (ref 24–336)

## 2020-01-13 MED ORDER — IPRATROPIUM BROMIDE HFA 17 MCG/ACT IN AERS
2.0000 | INHALATION_SPRAY | Freq: Four times a day (QID) | RESPIRATORY_TRACT | Status: DC | PRN
  Filled 2020-01-13: qty 12.9

## 2020-01-13 MED ORDER — INSULIN ASPART 100 UNIT/ML ~~LOC~~ SOLN
10.0000 [IU] | Freq: Three times a day (TID) | SUBCUTANEOUS | Status: DC
  Administered 2020-01-13 – 2020-01-14 (×4): 10 [IU] via SUBCUTANEOUS

## 2020-01-13 NOTE — Progress Notes (Signed)
PROGRESS NOTE  Earl Francis [id number]:[id number] DOB: 06-25-1961 DOA: 01/09/2020 PCP: System, Pcp Not In  Brief History   Shuaib Cranfordis a 59 y.o.malewith medical history significant oftype 2 DM, HTN, presented withincreasing short of breath and hypoxia for 8 days.He tested Covid +4 days prior to admission which was 28 January.In-house COVID-19 test positive on 01/09/2020. Chest x-ray bilateral pulmonary infiltrates worse at bases. Procalcitonin negative. TRHwas asked to admit. Started on COVID-19 directed therapies.  Able to wean to room air when sitting. Still has considerable dyspnea with exertion. Wife anxious about return home. He continues to have severe dyspnea/lightheadedness with ambulation.   Consultants  . None  Procedures  . None  Antibiotics   Anti-infectives (From admission, onward)   Start     Dose/Rate Route Frequency Ordered Stop   01/10/20 1000  remdesivir 100 mg in sodium chloride 0.9 % 100 mL IVPB     100 mg 200 mL/hr over 30 Minutes Intravenous Daily 01/09/20 1239 01/13/20 1100   01/09/20 1330  remdesivir 200 mg in sodium chloride 0.9% 250 mL IVPB     200 mg 580 mL/hr over 30 Minutes Intravenous Once 01/09/20 1239 01/10/20 0811    .   Subjective  The patient is resting comfortably. No new complaints.  Objective   Vitals:  Vitals:   01/13/20 1300 01/13/20 1616  BP: 137/84 (!) 142/80  Pulse: 71 77  Resp: (!) 22 20  Temp:  97.8 F (36.6 C)  SpO2: 92% 95%   Exam:  Constitutional:  . The patient is awake, alert, and oriented x 3. No acute distress. Respiratory:  . No increased work of breathing. . No wheezes, rales, or rhonchi . No tactile fremitus Cardiovascular:  . Regular rate and rhythm . No murmurs, ectopy, or gallups. . No lateral PMI. No thrills. Abdomen:  . Abdomen is soft, non-tender, non-distended . No hernias, masses, or organomegaly . Normoactive bowel sounds.  Musculoskeletal:  . No cyanosis, clubbing, or edema Skin:    . No rashes, lesions, ulcers . palpation of skin: no induration or nodules Neurologic:  . CN 2-12 intact . Sensation all 4 extremities intact Psychiatric:  . Mental status o Mood, affect appropriate o Orientation to person, place, time  . judgment and insight appear intact   I have personally reviewed the following:   Today's Data  . Vitals, CMP, CBC, Ferritin, CRP, D Dimer.  Imaging  . CXR 01/09/2020  Scheduled Meds: . vitamin C  500 mg Oral Daily  . cholecalciferol  1,000 Units Oral Daily  . dexamethasone (DECADRON) injection  6 mg Intravenous Q24H  . enoxaparin (LOVENOX) injection  55 mg Subcutaneous Q24H  . finasteride  5 mg Oral Daily  . fluticasone  1 spray Each Nare Daily  . folic acid  1 mg Oral Daily  . insulin aspart  0-9 Units Subcutaneous TID WC  . insulin aspart  10 Units Subcutaneous TID WC  . insulin detemir  12 Units Subcutaneous BID  . linagliptin  5 mg Oral Daily  . loratadine  10 mg Oral Daily  . multivitamin with minerals  1 tablet Oral Daily  . rosuvastatin  10 mg Oral Daily  . thiamine  100 mg Oral Daily  . zinc sulfate  220 mg Oral Daily   Continuous Infusions:  Active Problems:   COVID-19 virus infection   COVID-19   Type 2 diabetes mellitus with hyperglycemia ([place])   Benign essential hypertension   Hyperlipidemia   LOS: 3 days  A & P   Acute hypoxemic respiratory failure secondary to COVID-19 pneumonia, slowly improving Weaning oxygen to >92% Day number4out of 5 of remdesivir Day number4out of 10 of Decadron Continue bronchodilators albuterol inhaler 2 puffs every 6 hours and ipratropium inhaler 2 puffs every 6 hours Incentivespirometer and flutter valve Continue vitamin C, zinc and D3 Continue DVT prophylaxis Trend inflammatory markers daily Mobilize with assistance and fall precautions  Type 2 diabetes withhyperglycemia Hemoglobin A1c 8.2 Hyperglycemia likely exacerbated by steroids Diabetes coordinator  assisting. Levemir 12units twice daily and NovoLog 6 units 3 times daily. Continue insulin sliding scale Added linagliptin  Wife and patient about needing insulin on discharge---can likely add glipizide to home regimen for steroid induced hyperglycemiavs. Awaiting subsiding steroid effect and continue metformin.  Hyperlipidemia Resume home medication  Essential hypertension Blood pressure stable Resume home medications  BPH Resume home medication  Alcohol use disorder No evidence of alcohol withdrawal Continue multivitamin, folic acid and thiamine.  Hyponatremia, Secondary to hyperglycemia   HTN - Holding ACE, amlodipine, metoprolol--restart as able  HLD - Restarted statin  BPH - Restarted finasteride  DVT prophylaxis: Lovenox SQ Code Status: Full code  Family Communication: Patient Disposition Plan: Home 2/1 likely  [name], DO [facility] Direct contact: see [email or web address]  7PM-7AM contact night coverage as above 01/13/2020, 7:13 PM  LOS: 3 days

## 2020-01-13 NOTE — Progress Notes (Signed)
Inpatient Diabetes Program Recommendations  AACE/ADA: New Consensus Statement on Inpatient Glycemic Control (2015)  Target Ranges:  Prepandial:   less than 140 mg/dL      Peak postprandial:   less than 180 mg/dL (1-2 hours)      Critically ill patients:  140 - 180 mg/dL   Lab Results  Component Value Date   GLUCAP 181 (H) 01/13/2020   HGBA1C 8.2 (H) 01/09/2020    Review of Glycemic Control Results for KILEY, TORRENCE (MRN [id number]) as of 01/13/2020 11:30  Ref. Range 01/12/2020 11:50 01/12/2020 16:46 01/12/2020 21:08 01/13/2020 08:20  Glucose-Capillary Latest Ref Range: 70 - 99 mg/dL 660 (H) 630 (H) 160 (H) 181 (H)   Diabetes history: Type 2 DM Outpatient Diabetes medications: Metformin 1000 mg QD Current orders for Inpatient glycemic control: Novolog 0-9 units TID, Tradjenta 5 mg QD, Levemir 12 units BID, Novolog 6 units TID Decadron 6 mg QD  Inpatient Diabetes Program Recommendations:    Consider increasing Novolog 10 units TID (assuming patient is consuming >50% of meals).   Thanks, Lujean Rave, MSN, RNC-OB Diabetes Coordinator [phone] (8a-5p)

## 2020-01-14 LAB — D-DIMER, QUANTITATIVE: D-Dimer, Quant: <0.27 ug{FEU}/mL (ref 0.00–0.50)

## 2020-01-14 LAB — CBC WITH DIFFERENTIAL/PLATELET
Abs Immature Granulocytes: 0.08 K/uL — ABNORMAL HIGH (ref 0.00–0.07)
Basophils Absolute: 0 K/uL (ref 0.0–0.1)
Basophils Relative: 0 %
Eosinophils Absolute: 0 K/uL (ref 0.0–0.5)
Eosinophils Relative: 0 %
HCT: 38.5 % — ABNORMAL LOW (ref 39.0–52.0)
Hemoglobin: 13.3 g/dL (ref 13.0–17.0)
Immature Granulocytes: 1 %
Lymphocytes Relative: 32 %
Lymphs Abs: 2.4 K/uL (ref 0.7–4.0)
MCH: 31.7 pg (ref 26.0–34.0)
MCHC: 34.5 g/dL (ref 30.0–36.0)
MCV: 91.9 fL (ref 80.0–100.0)
Monocytes Absolute: 0.9 K/uL (ref 0.1–1.0)
Monocytes Relative: 13 %
Neutro Abs: 4 K/uL (ref 1.7–7.7)
Neutrophils Relative %: 54 %
Platelets: 235 K/uL (ref 150–400)
RBC: 4.19 MIL/uL — ABNORMAL LOW (ref 4.22–5.81)
RDW: 11.9 % (ref 11.5–15.5)
WBC: 7.4 K/uL (ref 4.0–10.5)
nRBC: 0 % (ref 0.0–0.2)

## 2020-01-14 LAB — COMPREHENSIVE METABOLIC PANEL
ALT: 52 U/L — ABNORMAL HIGH (ref 0–44)
AST: 30 U/L (ref 15–41)
Albumin: 2.8 g/dL — ABNORMAL LOW (ref 3.5–5.0)
Alkaline Phosphatase: 43 U/L (ref 38–126)
Anion gap: 10 (ref 5–15)
BUN: 17 mg/dL (ref 6–20)
CO2: 21 mmol/L — ABNORMAL LOW (ref 22–32)
Calcium: 8.4 mg/dL — ABNORMAL LOW (ref 8.9–10.3)
Chloride: 102 mmol/L (ref 98–111)
Creatinine, Ser: 0.7 mg/dL (ref 0.61–1.24)
GFR calc Af Amer: >60 mL/min (ref >60)
GFR calc non Af Amer: >60 mL/min (ref >60)
Glucose, Bld: 173 mg/dL — ABNORMAL HIGH (ref 70–99)
Potassium: 3.6 mmol/L (ref 3.5–5.1)
Sodium: 133 mmol/L — ABNORMAL LOW (ref 135–145)
Total Bilirubin: 0.8 mg/dL (ref 0.3–1.2)
Total Protein: 6 g/dL — ABNORMAL LOW (ref 6.5–8.1)

## 2020-01-14 LAB — FERRITIN: Ferritin: 1113 ng/mL — ABNORMAL HIGH (ref 24–336)

## 2020-01-14 LAB — CULTURE, BLOOD (ROUTINE X 2)
Culture: NO GROWTH
Culture: NO GROWTH
Special Requests: ADEQUATE
Special Requests: ADEQUATE

## 2020-01-14 LAB — GLUCOSE, CAPILLARY
Glucose-Capillary: 142 mg/dL — ABNORMAL HIGH (ref 70–99)
Glucose-Capillary: 276 mg/dL — ABNORMAL HIGH (ref 70–99)

## 2020-01-14 LAB — PHOSPHORUS: Phosphorus: 3.7 mg/dL (ref 2.5–4.6)

## 2020-01-14 LAB — MAGNESIUM: Magnesium: 2.3 mg/dL (ref 1.7–2.4)

## 2020-01-14 LAB — C-REACTIVE PROTEIN: CRP: 2 mg/dL — ABNORMAL HIGH (ref <1.0)

## 2020-01-14 MED ORDER — LINAGLIPTIN 5 MG PO TABS: 5.0000 mg | ORAL_TABLET | Freq: Every day | ORAL | 0 refills | Status: DC

## 2020-01-14 MED ORDER — ZINC SULFATE 220 (50 ZN) MG PO CAPS: 220.0000 mg | ORAL_CAPSULE | Freq: Every day | ORAL | 0 refills | Status: DC

## 2020-01-14 MED ORDER — INSULIN DETEMIR 100 UNIT/ML ~~LOC~~ SOLN: 15.0000 [IU] | Freq: Two times a day (BID) | SUBCUTANEOUS | 0 refills | Status: DC

## 2020-01-14 MED ORDER — ASCORBIC ACID 500 MG PO TABS: 500.0000 mg | ORAL_TABLET | Freq: Every day | ORAL | 0 refills | Status: DC

## 2020-01-14 MED ORDER — FLUTICASONE PROPIONATE 50 MCG/ACT NA SUSP: 1.0000 | Freq: Every day | NASAL | 0 refills | Status: DC

## 2020-01-14 MED ORDER — THIAMINE HCL 100 MG PO TABS: 100.0000 mg | ORAL_TABLET | Freq: Every day | ORAL | 0 refills | Status: DC

## 2020-01-14 MED ORDER — DEXAMETHASONE 6 MG PO TABS: 6.0000 mg | ORAL_TABLET | Freq: Every day | ORAL | 0 refills | Status: AC

## 2020-01-14 MED ORDER — ADULT MULTIVITAMIN W/MINERALS CH: 1.0000 | ORAL_TABLET | Freq: Every day | ORAL | 0 refills | Status: DC

## 2020-01-14 MED ORDER — LORATADINE 10 MG PO TABS: 10.0000 mg | ORAL_TABLET | Freq: Every day | ORAL | 0 refills | Status: DC

## 2020-01-14 MED ORDER — VITAMIN D3 25 MCG PO TABS: 1000.0000 [IU] | ORAL_TABLET | Freq: Every day | ORAL | 0 refills | Status: AC

## 2020-01-14 NOTE — Care Management (Signed)
Pt has been deemed stable for discharge home today.  CM reviewed chart for TOC needs/orders - none determined.  Discharge order is signed - CM signing off

## 2020-01-14 NOTE — Discharge Summary (Signed)
Physician Discharge Summary  [name] BPZ:[id number] DOB: 10/24/1961 DOA: 01/09/2020  PCP: System, Pcp Not In  Admit date: 01/09/2020 Discharge date: 01/14/2020  Recommendations for Outpatient Follow-up:  1. Follow up with PCP in 7-10 days. 2. Check glucoses twice daily and take records in when you visit your PCP.  Discharge Diagnoses: Principal diagnosis is #1 1. Acute hypoxemic respiratory failure secondary to COVID-19 pneumonia 2. DM II with hyperglycemia 3. Hyperlipidemia 4. Essential Hypertension 5. BPH 6. Alcohol use disorder 7. Hyponatremia 8. BPH  Discharge Condition: Fair  Disposition: Home  Diet recommendation: Hearth healthy with modified carbohydrates.  Filed Weights   01/09/20 1013 01/12/20 0300 01/13/20 0508  Weight: 106.6 kg 103.1 kg 102.6 kg    History of present illness:  Earl Francis is a 59 y.o. male with medical history significant of IDDM, HTN, presented with increasing short of breath and hypoxia for 8 days.  Covid positive as an outpatient 4 days ago.   He has had dry cough, fatigue, loose diarrhea, low-grade subjective fever with no chills.   For last 2 days, he has been much more short of breath. States he has trouble getting up and moving around. He checked his pulse ox at home last night, which was 84 or 88%.   This morning his breathing symptoms worsen, significant short of breath with minimum activity. ED Course: ED physician found the patient very tachypneic, talking in broken sentences, breathing rate increased to >30s with minimum activity.  Hospital Course:  Earl Cranfordis a 59 y.o.malewith medical history significant oftype 2 DM, HTN, presented withincreasing short of breath and hypoxia for 8 days.He tested Covid +4 days prior to admission which was 28 January.In-house COVID-19 test positive on 01/09/2020. Chest x-ray bilateral pulmonary infiltrates worse at bases. Procalcitonin negative. TRHwas asked to admit. Started on  COVID-19 directed therapies.  Today the patient was able to ambulate on room air without tachypnea, hypoxia, or lightheadedness. He will be discharged to home.  Today's assessment: S: The patient is sitting up at bedside. No new complaints. O: Vitals:  Vitals:   01/14/20 0528 01/14/20 0928  BP: 126/81 129/84  Pulse: 63 70  Resp: 19 18  Temp: 97.8 F (36.6 C) 98.4 F (36.9 C)  SpO2: 97% 93%   Constitutional:   The patient is awake, alert, and oriented x 3. No acute distress. Respiratory:   No increased work of breathing.  No wheezes, rales, or rhonchi  No tactile fremitus Cardiovascular:   Regular rate and rhythm  No murmurs, ectopy, or gallups.  No lateral PMI. No thrills. Abdomen:   Abdomen is soft, non-tender, non-distended  No hernias, masses, or organomegaly  Normoactive bowel sounds.  Musculoskeletal:   No cyanosis, clubbing, or edema Skin:   No rashes, lesions, ulcers  palpation of skin: no induration or nodules Neurologic:   CN 2-12 intact  Sensation all 4 extremities intact Psychiatric:   Mental status ? Mood, affect appropriate ? Orientation to person, place, time   judgment and insight appear intact   Discharge Instructions  Discharge Instructions    Activity as tolerated - No restrictions   Complete by: As directed    Call MD for:  difficulty breathing, headache or visual disturbances   Complete by: As directed    Call MD for:  persistant dizziness or light-headedness   Complete by: As directed    Call MD for:  persistant nausea and vomiting   Complete by: As directed    Diet - low sodium heart  healthy   Complete by: As directed    Diet Carb Modified   Complete by: As directed    Discharge instructions   Complete by: As directed    Follow up with PCP in 7-10 days. Check glucoses twice daily and take records in when you visit your PCP.   Increase activity slowly   Complete by: As directed    MyChart COVID-19 home  monitoring program   Complete by: Jan 14, 2020    Is the patient willing to use the [place] for home monitoring?: Yes   Temperature monitoring   Complete by: Jan 14, 2020    After how many days would you like to receive a notification of this patient's flowsheet entries?: 1     Allergies as of 01/14/2020   No Known Allergies     Medication List    STOP taking these medications   lisinopril 40 MG tablet Commonly known as: ZESTRIL     TAKE these medications   albuterol 108 (90 Base) MCG/ACT inhaler Commonly known as: VENTOLIN HFA Inhale 2 puffs into the lungs every 4 (four) hours as needed for wheezing or shortness of breath.   amLODipine 5 MG tablet Commonly known as: NORVASC Take 5 mg by mouth daily.   ascorbic acid 500 MG tablet Commonly known as: VITAMIN C Take 1 tablet (500 mg total) by mouth daily. Start taking on: January 15, 2020   dexamethasone 6 MG tablet Commonly known as: Decadron Take 1 tablet (6 mg total) by mouth daily for 4 days.   finasteride 5 MG tablet Commonly known as: PROSCAR Take 5 mg by mouth daily.   fluticasone 50 MCG/ACT nasal spray Commonly known as: FLONASE Place 1 spray into both nostrils daily. Start taking on: January 15, 2020   insulin detemir 100 UNIT/ML injection Commonly known as: LEVEMIR Inject 0.15 mLs (15 Units total) into the skin 2 (two) times daily for 4 days.   linagliptin 5 MG Tabs tablet Commonly known as: TRADJENTA Take 1 tablet (5 mg total) by mouth daily. Start taking on: January 15, 2020   loratadine 10 MG tablet Commonly known as: CLARITIN Take 1 tablet (10 mg total) by mouth daily. Start taking on: January 15, 2020   metFORMIN 500 MG 24 hr tablet Commonly known as: GLUCOPHAGE-XR Take 1,000 mg by mouth daily.   metoprolol succinate 50 MG 24 hr tablet Commonly known as: TOPROL-XL Take 50 mg by mouth daily.   montelukast 10 MG tablet Commonly known as: SINGULAIR Take 10 mg by mouth daily.    multivitamin with minerals Tabs tablet Take 1 tablet by mouth daily. Start taking on: January 15, 2020   rosuvastatin 10 MG tablet Commonly known as: CRESTOR Take 10 mg by mouth daily.   thiamine 100 MG tablet Take 1 tablet (100 mg total) by mouth daily. Start taking on: January 15, 2020   triamcinolone cream 0.1 % Commonly known as: KENALOG Apply 1 application topically 2 (two) times daily as needed (rash).   Vitamin D3 25 MCG tablet Commonly known as: Vitamin D Take 1 tablet (1,000 Units total) by mouth daily. Start taking on: January 15, 2020   zinc sulfate 220 (50 Zn) MG capsule Take 1 capsule (220 mg total) by mouth daily. Start taking on: January 15, 2020      No Known Allergies  The results of significant diagnostics from this hospitalization (including imaging, microbiology, ancillary and laboratory) are listed below for reference.    Significant Diagnostic  Studies: DG Chest Port 1 View  Result Date: 01/09/2020 CLINICAL DATA:  COVID-19 positive.  Short of breath EXAM: PORTABLE CHEST 1 VIEW COMPARISON:  None. FINDINGS: Mild bibasilar airspace disease left greater than right. Possible pneumonia or atelectasis. Decreased lung volume. Negative for heart failure or effusion. IMPRESSION: Mild bibasilar airspace disease which could be pneumonia or atelectasis. Hypoventilation with decreased lung volume. Electronically Signed   By: Marlan Palau M.D.   On: 01/09/2020 10:56    Microbiology: Recent Results (from the past 240 hour(s))  Blood Culture (routine x 2)     Status: None   Collection Time: 01/09/20 10:15 AM   Specimen: BLOOD  Result Value Ref Range Status   Specimen Description   Final    BLOOD RIGHT ANTECUBITAL Performed at [facility], [address], [place], [place] [address]    Special Requests   Final    BOTTLES DRAWN AEROBIC AND ANAEROBIC Blood Culture adequate volume   Culture NO GROWTH 5 DAYS  Final   Report Status 01/14/2020 FINAL  Final   Blood Culture (routine x 2)     Status: None   Collection Time: 01/09/20  2:55 PM   Specimen: BLOOD  Result Value Ref Range Status   Specimen Description BLOOD LEFT ANTECUBITAL  Final   Special Requests   Final    BOTTLES DRAWN AEROBIC AND ANAEROBIC Blood Culture adequate volume Performed at [facility], [address], [place], [place] [address]    Culture NO GROWTH 5 DAYS  Final   Report Status 01/14/2020 FINAL  Final     Labs: Basic Metabolic Panel: Recent Labs  Lab 01/10/20 0525 01/11/20 0500 01/12/20 0702 01/13/20 0453 01/14/20 0302  NA 132* 131* 134* 135 133*  K 4.4 4.0 4.1 3.9 3.6  CL 101 98 101 101 102  CO2 19* 22 22 22  21*  GLUCOSE 336* 286* 220* 225* 173*  BUN 27* 28* 21* 19 17  CREATININE 0.99 0.94 0.82 0.68 0.70  CALCIUM 8.2* 8.3* 8.4* 8.6* 8.4*  MG 2.0 2.3 2.3 2.1 2.3  PHOS 3.1 4.0 3.9 3.4 3.7   Liver Function Tests: Recent Labs  Lab 01/10/20 0525 01/11/20 0500 01/12/20 0702 01/13/20 0453 01/14/20 0302  AST 38 32 33 39 30  ALT 49* 45* 44 53* 52*  ALKPHOS 43 41 40 40 43  BILITOT 0.7 0.6 0.7 0.8 0.8  PROT 6.7 6.2* 6.2* 5.8* 6.0*  ALBUMIN 3.0* 2.6* 2.8* 2.7* 2.8*   No results for input(s): LIPASE, AMYLASE in the last 168 hours. No results for input(s): AMMONIA in the last 168 hours. CBC: Recent Labs  Lab 01/10/20 0525 01/11/20 0500 01/12/20 0702 01/13/20 0453 01/14/20 0302  WBC 4.3 5.6 5.7 5.8 7.4  NEUTROABS 2.7 3.6 3.6 3.1 4.0  HGB 14.4 13.2 13.5 13.2 13.3  HCT 40.6 37.9* 39.0 38.2* 38.5*  MCV 90.4 91.5 92.0 91.0 91.9  PLT 167 176 195 222 235   Cardiac Enzymes: No results for input(s): CKTOTAL, CKMB, CKMBINDEX, TROPONINI in the last 168 hours. BNP: BNP (last 3 results) Recent Labs    01/10/20 0525  BNP 23.7    ProBNP (last 3 results) No results for input(s): PROBNP in the last 8760 hours.  CBG: Recent Labs  Lab 01/13/20 1129 01/13/20 1619 01/13/20 2125 01/14/20 0803 01/14/20 1219  GLUCAP 242* 392* 235* 142* 276*     Active Problems:   COVID-19 virus infection   COVID-19   Type 2 diabetes mellitus with hyperglycemia (HCC)  Benign essential hypertension   Hyperlipidemia   Time coordinating discharge: 38 mintues.  Signed:        Gaylia Kassel, DO [facility]  01/14/2020, 4:46 PM

## 2020-01-14 NOTE — Progress Notes (Signed)
SATURATION QUALIFICATIONS: (This note is used to comply with regulatory documentation for home oxygen)  Patient Saturations on Room Air at Rest = 93%  Patient Saturations on Room Air while Ambulating = 95%  Patient Saturations on N/A Liters of oxygen while Ambulating = N/A  Please briefly explain why patient needs home oxygen: Patient sating 93-95% on room air.

## 2020-01-16 DIAGNOSIS — E119 Type 2 diabetes mellitus without complications: Secondary | ICD-10-CM | POA: Diagnosis not present

## 2020-01-16 DIAGNOSIS — U071 COVID-19: Secondary | ICD-10-CM | POA: Diagnosis not present

## 2020-01-16 DIAGNOSIS — E782 Mixed hyperlipidemia: Secondary | ICD-10-CM | POA: Diagnosis not present

## 2020-01-16 DIAGNOSIS — J45909 Unspecified asthma, uncomplicated: Secondary | ICD-10-CM | POA: Diagnosis not present

## 2020-02-06 DIAGNOSIS — I10 Essential (primary) hypertension: Secondary | ICD-10-CM | POA: Diagnosis not present

## 2020-02-06 DIAGNOSIS — E782 Mixed hyperlipidemia: Secondary | ICD-10-CM | POA: Diagnosis not present

## 2020-02-06 DIAGNOSIS — E119 Type 2 diabetes mellitus without complications: Secondary | ICD-10-CM | POA: Diagnosis not present

## 2020-02-06 DIAGNOSIS — L719 Rosacea, unspecified: Secondary | ICD-10-CM | POA: Diagnosis not present

## 2020-03-12 ENCOUNTER — Ambulatory Visit: Payer: BC Managed Care – PPO

## 2020-03-13 ENCOUNTER — Ambulatory Visit: Payer: BC Managed Care – PPO

## 2020-03-31 DIAGNOSIS — I10 Essential (primary) hypertension: Secondary | ICD-10-CM | POA: Diagnosis not present

## 2020-03-31 DIAGNOSIS — E119 Type 2 diabetes mellitus without complications: Secondary | ICD-10-CM | POA: Diagnosis not present

## 2020-04-06 ENCOUNTER — Ambulatory Visit: Payer: BC Managed Care – PPO

## 2020-04-13 ENCOUNTER — Ambulatory Visit: Payer: BC Managed Care – PPO

## 2020-04-30 DIAGNOSIS — J309 Allergic rhinitis, unspecified: Secondary | ICD-10-CM | POA: Diagnosis not present

## 2020-04-30 DIAGNOSIS — E782 Mixed hyperlipidemia: Secondary | ICD-10-CM | POA: Diagnosis not present

## 2020-04-30 DIAGNOSIS — E119 Type 2 diabetes mellitus without complications: Secondary | ICD-10-CM | POA: Diagnosis not present

## 2020-04-30 DIAGNOSIS — I10 Essential (primary) hypertension: Secondary | ICD-10-CM | POA: Diagnosis not present

## 2020-05-07 DIAGNOSIS — M9902 Segmental and somatic dysfunction of thoracic region: Secondary | ICD-10-CM | POA: Diagnosis not present

## 2020-05-07 DIAGNOSIS — M9907 Segmental and somatic dysfunction of upper extremity: Secondary | ICD-10-CM | POA: Diagnosis not present

## 2020-05-07 DIAGNOSIS — M5412 Radiculopathy, cervical region: Secondary | ICD-10-CM | POA: Diagnosis not present

## 2020-05-07 DIAGNOSIS — M9901 Segmental and somatic dysfunction of cervical region: Secondary | ICD-10-CM | POA: Diagnosis not present

## 2020-05-18 DIAGNOSIS — M9901 Segmental and somatic dysfunction of cervical region: Secondary | ICD-10-CM | POA: Diagnosis not present

## 2020-05-18 DIAGNOSIS — M5412 Radiculopathy, cervical region: Secondary | ICD-10-CM | POA: Diagnosis not present

## 2020-05-18 DIAGNOSIS — M9902 Segmental and somatic dysfunction of thoracic region: Secondary | ICD-10-CM | POA: Diagnosis not present

## 2020-05-18 DIAGNOSIS — M9907 Segmental and somatic dysfunction of upper extremity: Secondary | ICD-10-CM | POA: Diagnosis not present

## 2020-05-21 DIAGNOSIS — M9907 Segmental and somatic dysfunction of upper extremity: Secondary | ICD-10-CM | POA: Diagnosis not present

## 2020-05-21 DIAGNOSIS — M9902 Segmental and somatic dysfunction of thoracic region: Secondary | ICD-10-CM | POA: Diagnosis not present

## 2020-05-21 DIAGNOSIS — M5412 Radiculopathy, cervical region: Secondary | ICD-10-CM | POA: Diagnosis not present

## 2020-05-21 DIAGNOSIS — M9901 Segmental and somatic dysfunction of cervical region: Secondary | ICD-10-CM | POA: Diagnosis not present

## 2020-05-25 DIAGNOSIS — M9901 Segmental and somatic dysfunction of cervical region: Secondary | ICD-10-CM | POA: Diagnosis not present

## 2020-05-25 DIAGNOSIS — M5412 Radiculopathy, cervical region: Secondary | ICD-10-CM | POA: Diagnosis not present

## 2020-05-25 DIAGNOSIS — M9907 Segmental and somatic dysfunction of upper extremity: Secondary | ICD-10-CM | POA: Diagnosis not present

## 2020-05-25 DIAGNOSIS — M9902 Segmental and somatic dysfunction of thoracic region: Secondary | ICD-10-CM | POA: Diagnosis not present

## 2020-05-28 DIAGNOSIS — M9907 Segmental and somatic dysfunction of upper extremity: Secondary | ICD-10-CM | POA: Diagnosis not present

## 2020-05-28 DIAGNOSIS — M5412 Radiculopathy, cervical region: Secondary | ICD-10-CM | POA: Diagnosis not present

## 2020-05-28 DIAGNOSIS — M9901 Segmental and somatic dysfunction of cervical region: Secondary | ICD-10-CM | POA: Diagnosis not present

## 2020-05-28 DIAGNOSIS — M9902 Segmental and somatic dysfunction of thoracic region: Secondary | ICD-10-CM | POA: Diagnosis not present

## 2020-06-04 DIAGNOSIS — M9901 Segmental and somatic dysfunction of cervical region: Secondary | ICD-10-CM | POA: Diagnosis not present

## 2020-06-04 DIAGNOSIS — M5412 Radiculopathy, cervical region: Secondary | ICD-10-CM | POA: Diagnosis not present

## 2020-06-04 DIAGNOSIS — M9902 Segmental and somatic dysfunction of thoracic region: Secondary | ICD-10-CM | POA: Diagnosis not present

## 2020-06-04 DIAGNOSIS — M9907 Segmental and somatic dysfunction of upper extremity: Secondary | ICD-10-CM | POA: Diagnosis not present

## 2020-06-08 DIAGNOSIS — M9901 Segmental and somatic dysfunction of cervical region: Secondary | ICD-10-CM | POA: Diagnosis not present

## 2020-06-08 DIAGNOSIS — M5412 Radiculopathy, cervical region: Secondary | ICD-10-CM | POA: Diagnosis not present

## 2020-06-08 DIAGNOSIS — M9902 Segmental and somatic dysfunction of thoracic region: Secondary | ICD-10-CM | POA: Diagnosis not present

## 2020-06-08 DIAGNOSIS — M9907 Segmental and somatic dysfunction of upper extremity: Secondary | ICD-10-CM | POA: Diagnosis not present

## 2020-06-22 DIAGNOSIS — M9907 Segmental and somatic dysfunction of upper extremity: Secondary | ICD-10-CM | POA: Diagnosis not present

## 2020-06-22 DIAGNOSIS — M5412 Radiculopathy, cervical region: Secondary | ICD-10-CM | POA: Diagnosis not present

## 2020-06-22 DIAGNOSIS — M9901 Segmental and somatic dysfunction of cervical region: Secondary | ICD-10-CM | POA: Diagnosis not present

## 2020-06-22 DIAGNOSIS — M9902 Segmental and somatic dysfunction of thoracic region: Secondary | ICD-10-CM | POA: Diagnosis not present

## 2020-08-20 DIAGNOSIS — H5201 Hypermetropia, right eye: Secondary | ICD-10-CM | POA: Diagnosis not present

## 2020-08-20 DIAGNOSIS — E119 Type 2 diabetes mellitus without complications: Secondary | ICD-10-CM | POA: Diagnosis not present

## 2020-08-20 DIAGNOSIS — H524 Presbyopia: Secondary | ICD-10-CM | POA: Diagnosis not present

## 2020-09-23 DIAGNOSIS — M6208 Separation of muscle (nontraumatic), other site: Secondary | ICD-10-CM | POA: Diagnosis not present

## 2020-09-23 DIAGNOSIS — K429 Umbilical hernia without obstruction or gangrene: Secondary | ICD-10-CM | POA: Diagnosis not present

## 2020-10-23 DIAGNOSIS — K429 Umbilical hernia without obstruction or gangrene: Secondary | ICD-10-CM | POA: Diagnosis not present

## 2020-10-27 DIAGNOSIS — J309 Allergic rhinitis, unspecified: Secondary | ICD-10-CM | POA: Diagnosis not present

## 2020-10-27 DIAGNOSIS — E119 Type 2 diabetes mellitus without complications: Secondary | ICD-10-CM | POA: Diagnosis not present

## 2020-10-27 DIAGNOSIS — I10 Essential (primary) hypertension: Secondary | ICD-10-CM | POA: Diagnosis not present

## 2020-10-27 DIAGNOSIS — E782 Mixed hyperlipidemia: Secondary | ICD-10-CM | POA: Diagnosis not present

## 2020-11-06 DIAGNOSIS — W228XXA Striking against or struck by other objects, initial encounter: Secondary | ICD-10-CM | POA: Diagnosis not present

## 2020-11-06 DIAGNOSIS — S0501XA Injury of conjunctiva and corneal abrasion without foreign body, right eye, initial encounter: Secondary | ICD-10-CM | POA: Diagnosis not present

## 2020-12-28 ENCOUNTER — Other Ambulatory Visit (HOSPITAL_COMMUNITY): Payer: BC Managed Care – PPO

## 2020-12-28 NOTE — Progress Notes (Signed)
[id number] [name], [place] - [address] AT [facility] [address] [facility] [address] [place] [address] Phone: [phone] Fax: [phone]      Your procedure is scheduled on Thursday, December 31, 2020 at 9:00 AM.  Report to [facility] Main Entrance "A" at 6:30 A.M., and check in at the Admitting office.  Call this number if you have problems the morning of surgery:  [phone]  Call [phone] if you have any questions prior to your surgery date Monday-Friday 8am-4pm    Remember:  Do not eat or drink after midnight the night before your surgery    Take these medicines the morning of surgery with A SIP OF WATER:  amLODipine (NORVASC) finasteride (PROSCAR) fluticasone (FLONASE)  loratadine (CLARITIN) metoprolol succinate (TOPROL-XL) montelukast (SINGULAIR) rosuvastatin (CRESTOR)  albuterol (VENTOLIN HFA) - if needed (Bring inhaler with you the morning of surgery.)  As of today, STOP taking any Aspirin (unless otherwise instructed by your surgeon) Aleve, Naproxen, Ibuprofen, Motrin, Advil, Goody's, BC's, all herbal medications, fish oil, and all vitamins.   WHAT DO I DO ABOUT MY DIABETES MEDICATION?   Marland Kitchen Do not take oral diabetes medicines (linagliptin (TRADJENTA) OR metFORMIN (GLUCOPHAGE-XR) ) the morning of surgery.  . THE NIGHT BEFORE SURGERY, take _____7______ units of _insulin detemir (LEVEMIR)___insulin.       . THE MORNING OF SURGERY, take ______7_______ units of _insulin detemir (LEVEMIR)____insulin.   HOW TO MANAGE YOUR DIABETES BEFORE AND AFTER SURGERY  Why is it important to control my blood sugar before and after surgery? . Improving blood sugar levels before and after surgery helps healing and can limit problems. . A way of improving blood sugar control is eating a healthy diet by: o  Eating less sugar and carbohydrates o  Increasing activity/exercise o  Talking with your doctor about reaching your blood sugar  goals . High blood sugars (greater than 180 mg/dL) can raise your risk of infections and slow your recovery, so you will need to focus on controlling your diabetes during the weeks before surgery. . Make sure that the doctor who takes care of your diabetes knows about your planned surgery including the date and location.  How do I manage my blood sugar before surgery? . Check your blood sugar at least 4 times a day, starting 2 days before surgery, to make sure that the level is not too high or low. . Check your blood sugar the morning of your surgery when you wake up and every 2 hours until you get to the Short Stay unit. o If your blood sugar is less than 70 mg/dL, you will need to treat for low blood sugar: - Do not take insulin. - Treat a low blood sugar (less than 70 mg/dL) with  cup of clear juice (cranberry or apple), 4 glucose tablets, OR glucose gel. - Recheck blood sugar in 15 minutes after treatment (to make sure it is greater than 70 mg/dL). If your blood sugar is not greater than 70 mg/dL on recheck, call [phone] for further instructions. . Report your blood sugar to the short stay nurse when you get to Short Stay.  . If you are admitted to the hospital after surgery: o Your blood sugar will be checked by the staff and you will probably be given insulin after surgery (instead of oral diabetes medicines) to make sure you have good blood sugar levels. o The goal for blood sugar control after surgery is 80-180  mg/dL.                      Do not wear jewelry.            Do not wear lotions, powders, colognes, or deodorant.            Do not shave 48 hours prior to surgery.  Men may shave face and neck.            Do not bring valuables to the hospital.            [facility] is not responsible for any belongings or valuables.  Do NOT Smoke (Tobacco/Vaping) or drink Alcohol 24 hours prior to your procedure If you use a CPAP at night, you may bring all equipment for your  overnight stay.   Contacts, glasses, dentures or bridgework may not be worn into surgery.      For patients admitted to the hospital, discharge time will be determined by your treatment team.   Patients discharged the day of surgery will not be allowed to drive home, and someone needs to stay with them for 24 hours.    Special instructions:   [place]- Preparing For Surgery  Before surgery, you can play an important role. Because skin is not sterile, your skin needs to be as free of germs as possible. You can reduce the number of germs on your skin by washing with CHG (chlorahexidine gluconate) Soap before surgery.  CHG is an antiseptic cleaner which kills germs and bonds with the skin to continue killing germs even after washing.    Oral Hygiene is also important to reduce your risk of infection.  Remember - BRUSH YOUR TEETH THE MORNING OF SURGERY WITH YOUR REGULAR TOOTHPASTE  Please do not use if you have an allergy to CHG or antibacterial soaps. If your skin becomes reddened/irritated stop using the CHG.  Do not shave (including legs and underarms) for at least 48 hours prior to first CHG shower. It is OK to shave your face.   Shower the NIGHT BEFORE SURGERY and the MORNING OF SURGERY with mild soap   Day of Surgery: Wear Clean/Comfortable clothing the morning of surgery Do not apply any deodorants/lotions.   Remember to brush your teeth WITH YOUR REGULAR TOOTHPASTE.   Please read over the following fact sheets that you were given.    COVID TEST SCHEDULED: 12/29/2020

## 2020-12-29 ENCOUNTER — Other Ambulatory Visit (HOSPITAL_COMMUNITY)
Admission: RE | Admit: 2020-12-29 | Discharge: 2020-12-29 | Disposition: A | Discharge status: Home / Self Care | Payer: BC Managed Care – PPO | Source: Ambulatory Visit | Attending: General Surgery | Admitting: General Surgery

## 2020-12-29 ENCOUNTER — Encounter (HOSPITAL_COMMUNITY): Payer: Self-pay | Admitting: General Surgery

## 2020-12-29 DIAGNOSIS — Z01818 Encounter for other preprocedural examination: Secondary | ICD-10-CM | POA: Diagnosis not present

## 2020-12-29 DIAGNOSIS — U071 COVID-19: Secondary | ICD-10-CM | POA: Diagnosis not present

## 2020-12-29 NOTE — Progress Notes (Addendum)
Spoke with pt for pre-op call. Pt is treated for HTN and Diabetes. Denies cardiac history. Pt states his fasting blood sugar is usually in the 120's-130's. He is not sure when or what his most recent A1C. He states Dr. Jeanmarie Hubert is his PCP.   Covid test scheduled for this afternoon. Instructed pt to quarantine after the test is done and continue until he comes to the hospital on Thursday.  Attempting to call Dr. Randel Books office for most recent A1C.

## 2020-12-30 ENCOUNTER — Ambulatory Visit: Payer: Self-pay | Admitting: General Surgery

## 2020-12-30 LAB — SARS CORONAVIRUS 2 (TAT 6-24 HRS): SARS Coronavirus 2: POSITIVE — AB

## 2020-12-30 NOTE — H&P (Signed)
History of Present Illness Earl Filler MD; 10/23/2020 9:29 AM) The patient is a 60 year old male who presents with an umbilical hernia.  Chief Complaint: Umbilical hernia  Patient is a 60 year old male who comes in secondary to umbilical hernia as well as rectus diastases.  Patient states he's been since the last several months. He does state that he notices some tightening sensation to his umbilicus and his waist. He states that his had no signs or symptoms of incarceration or granulation.  Patient's had no previous abdominal surgery. He is otherwise active and does do some heavy lifting at times.       Past Surgical History (Chanel Lonni Fix, CMA; 10/23/2020 8:55 AM) Knee Surgery  Left.  Diagnostic Studies History [facility] Lonni Fix, CMA; 10/23/2020 8:55 AM) Colonoscopy  1-5 years ago  Allergies (Chanel Lonni Fix, CMA; 10/23/2020 8:55 AM) No Known Drug Allergies  [10/23/2020]: Allergies Reconciled   Medication History (Chanel Lonni Fix, CMA; 10/23/2020 8:57 AM) Lisinopril (40MG  Tablet, Oral) Active. metFORMIN HCl ER (500MG  Tablet ER 24HR, Oral) Active. Metoprolol Succinate ER (100MG  Tablet ER 24HR, Oral) Active. OneTouch Verio (In Vitro) Active. Montelukast Sodium (10MG  Tablet, Oral) Active. Triamcinolone Acetonide (0.1% Cream, External) Active. Ketoconazole (2% Cream, External) Active. Pioglitazone HCl (15MG  Tablet, Oral) Active. Glimepiride (2MG  Tablet, Oral) Active. amLODIPine Besylate (5MG  Tablet, Oral) Active. Vitamin D (Oral) Specific strength unknown - Active. Medications Reconciled  Social History , CMA; 10/23/2020 8:55 AM) Alcohol use  Moderate alcohol use. Caffeine use  Tea. No drug use  Tobacco use  Never smoker.  Family History , CMA; 10/23/2020 8:55 AM) Cancer  Father. Diabetes Mellitus  Mother. Hypertension  Father, Mother.  Other Problems (Chanel , CMA; 10/23/2020 8:55 AM) Asthma  Diabetes Mellitus   High blood pressure  Umbilical Hernia Repair     Review of Systems MD; 10/23/2020 9:28 AM) General Present- Appetite Loss. Not Present- Chills, Fatigue, Fever, Night Sweats, Weight Gain and Weight Loss. Skin Not Present- Change in Wart/Mole, Dryness, Hives, Jaundice, New Lesions, Non-Healing Wounds, Rash and Ulcer. HEENT Present- Seasonal Allergies. Not Present- Earache, Hearing Loss, Hoarseness, Nose Bleed, Oral Ulcers, Ringing in the Ears, Sinus Pain, Sore Throat, Visual Disturbances, Wears glasses/contact lenses and Yellow Eyes. Respiratory Present- Snoring. Not Present- Bloody sputum, Chronic Cough, Difficulty Breathing and Wheezing. Breast Not Present- Breast Mass, Breast Pain, Nipple Discharge and Skin Changes. Cardiovascular Present- Leg Cramps and Swelling of Extremities. Not Present- Chest Pain, Difficulty Breathing Lying Down, Palpitations, Rapid Heart Rate and Shortness of Breath. Gastrointestinal Present- Indigestion. Not Present- Abdominal Pain, Bloating, Bloody Stool, Change in Bowel Habits, Chronic diarrhea, Constipation, Difficulty Swallowing, Excessive gas, Gets full quickly at meals, Hemorrhoids, Nausea, Rectal Pain and Vomiting. Male Genitourinary Not Present- Blood in Urine, Change in Urinary Stream, Frequency, Impotence, Nocturia, Painful Urination, Urgency and Urine Leakage. Musculoskeletal Not Present- Back Pain, Joint Pain, Joint Stiffness, Muscle Pain, Muscle Weakness and Swelling of Extremities. Neurological Not Present- Decreased Memory, Fainting, Headaches, Numbness, Seizures, Tingling, Tremor, Trouble walking and Weakness. Psychiatric Not Present- Anxiety, Bipolar, Change in Sleep Pattern, Depression, Fearful and Frequent crying. Endocrine Present- Cold Intolerance. Not Present- Excessive Hunger, Hair Changes, Heat Intolerance and New Diabetes. Hematology Not Present- Blood Thinners, Easy Bruising, Excessive bleeding, Gland problems, HIV and  Persistent Infections. All other systems negative  Vitals (Chanel Nolan CMA; 10/23/2020 8:57 AM) 10/23/2020 8:57 AM Weight: 251.25 lb Height: 70in Body Surface Area: 2.3 m Body Mass Index: 36.05 kg/m  Temp.: 97.26F  Pulse: 73 (Regular)  BP: 122/74(Sitting, Left  Arm, Standard)       Physical Exam Earl Filler MD; 10/23/2020 9:29 AM) The physical exam findings are as follows: Note: Constitutional: No acute distress, conversant, appears stated age  Eyes: Anicteric sclerae, moist conjunctiva, no lid lag  Neck: No thyromegaly, trachea midline, no cervical lymphadenopathy  Lungs: Clear to auscultation biilaterally, normal respiratory effot  Cardiovascular: regular rate & rhythm, no murmurs, no peripheal edema, pedal pulses 2+  GI: Soft, no masses or hepatosplenomegaly, non-tender to palpation  MSK: Normal gait, no clubbing cyanosis, edema  Skin: No rashes, palpation reveals normal skin turgor  Psychiatric: Appropriate judgment and insight, oriented to person, place, and time  Abdomen Inspection Hernias - Diastasis recti - Present. Umbilical hernia - Reducible.    Assessment & Plan Earl Filler MD; 10/23/2020 9:30 AM) UMBILICAL HERNIA WITHOUT OBSTRUCTION AND WITHOUT GANGRENE (K42.9) Impression: Patient is a 60 year old male with primary umbilical hernia.  1. The patient will like to proceed to the operating room for laparoscopic umbilical hernia repair with mesh.  2. I discussed with the patient the signs and symptoms of incarceration and strangulation and the need to proceed to the ER should they occur.  3. I discussed with the patient the risks and benefits of the procedure to include but not limited to: Infection, bleeding, damage to surrounding structures, possible need for further surgery, possible nerve pain, and possible recurrence. The patient was understanding and wishes to proceed.

## 2020-12-30 NOTE — Progress Notes (Signed)
Patient positive for covid on 12/29/20.   Dr. Derrell Lolling office made aware.

## 2020-12-31 ENCOUNTER — Ambulatory Visit (HOSPITAL_COMMUNITY): Admission: RE | Admit: 2020-12-31 | Payer: BC Managed Care – PPO | Source: Home / Self Care | Admitting: General Surgery

## 2020-12-31 SURGERY — REPAIR, HERNIA, UMBILICAL, LAPAROSCOPIC
Anesthesia: General

## 2021-02-01 ENCOUNTER — Encounter (HOSPITAL_COMMUNITY): Payer: Self-pay | Admitting: General Surgery

## 2021-02-01 ENCOUNTER — Other Ambulatory Visit: Payer: Self-pay

## 2021-02-01 NOTE — Progress Notes (Signed)
PCP - Dr Blair Heys Cardiologist - n/a  Chest x-ray - 01/09/20 (1V) EKG - 01/10/20 Stress Test - n/a ECHO - n/a Cardiac Cath - n/a  Fasting Blood Sugar - 120-130s Checks Blood Sugar 1 x week  . Do not take glimepiride, metformin and actos on the day of surgery..  . If your blood sugar is less than 70 mg/dL, you will need to treat for low blood sugar: o Treat a low blood sugar (less than 70 mg/dL) with  cup of clear juice (cranberry or apple), 4 glucose tablets, OR glucose gel. o Recheck blood sugar in 15 minutes after treatment (to make sure it is greater than 70 mg/dL). If your blood sugar is not greater than 70 mg/dL on recheck, call [phone] for further instructions.  ERAS: Clear liquids til 8 am day of surgery.  Anesthesia review: Yes  STOP now taking any Aspirin (unless otherwise instructed by your surgeon), Aleve, Naproxen, Ibuprofen, Motrin, Advil, Goody's, BC's, all herbal medications, fish oil, and all vitamins.   Coronavirus Screening Positive covid on 12/29/20.  No symptoms. Do you have any of the following symptoms:  Cough yes/no: No Fever (>100.31F)  yes/no: No Runny nose yes/no: No Sore throat yes/no: No Difficulty breathing/shortness of breath  yes/no: No  Have you traveled in the last 14 days and where? yes/no: No  Patient verbalized understanding of instructions that were given via phone.

## 2021-02-01 NOTE — Progress Notes (Signed)
Anesthesia Chart Review: Earl Francis    Case: [id number] Date/Time: 02/02/21 1045   Procedure: LAPAROSCOPIC UMBILICAL HERNIA REPAIR WITH MESH (N/A )   Anesthesia type: General   Pre-op diagnosis: UMBILICAL HERNIA   Location: [facility] ROOM 10 / [facility]   Surgeons: Axel Filler, MD      DISCUSSION: Patient is a 60 year old male scheduled for the above procedure. Surgery initially scheduled for 12/31/20, but 12/29/20 COVID-19 screening test was positive, no current symptoms. Case rescheduled for 02/02/21.   History includes never smoker, HTN, asthma, HLD, DM2, nasal septal surgery. + COVID-19 PNA 01/09/20(required temporary O2 @ 4L while hospitalized, s/p remdesivir, Decadron, MDI) and + COVID-19 12/29/20 pre-screen for surgery. Documented alcohol use: 15 cans of beer/week.  He is a same day work-up, so anesthesia team evaluation and labs/EKG as indicated on the day of surgery. He reported fasting CBGs ~ 120-130's.    VS: Ht 5\' 11"  (1.803 m)   Wt 111.1 kg   BMI 34.17 kg/m     PROVIDERS: , MD is PCP    LABS: For day of surgery. Currently, most recent lab results available include: Lab Results  Component Value Date   WBC 7.4 01/14/2020   HGB 13.3 01/14/2020   HCT 38.5 (L) 01/14/2020   PLT 235 01/14/2020   GLUCOSE 173 (H) 01/14/2020   TRIG 211 (H) 01/09/2020   ALT 52 (H) 01/14/2020   AST 30 01/14/2020   NA 133 (L) 01/14/2020   K 3.6 01/14/2020   CL 102 01/14/2020   CREATININE 0.70 01/14/2020   BUN 17 01/14/2020   CO2 21 (L) 01/14/2020   HGBA1C 8.2 (H) 01/09/2020     Sleep Study 10/12/13: IMPRESSION: Mild obstructive sleep apnea/hypopnea syndrome.  Notable events were worse in the supine position and during NREM sleep.  Light snoring.  There is no evidence of significant nocturnal myoclonus.  Noncritical desaturation.   EKG: Last EKG available is > 1 year ago--NSR on 01/09/20.   CV: N/A  Past Medical History:  Diagnosis Date  . Asthma   . Diabetes  mellitus without complication (HCC)    type2  . HLD (hyperlipidemia)   . Hypertension   . Seasonal allergies     Past Surgical History:  Procedure Laterality Date  . COLONOSCOPY    . KNEE ARTHROSCOPY Left   . NASAL SEPTUM SURGERY      MEDICATIONS: No current facility-administered medications for this encounter.   01/11/20 albuterol (VENTOLIN HFA) 108 (90 Base) MCG/ACT inhaler  . amLODipine (NORVASC) 5 MG tablet  . cholecalciferol (VITAMIN D) 25 MCG tablet  . finasteride (PROSCAR) 5 MG tablet  . glimepiride (AMARYL) 2 MG tablet  . ketoconazole (NIZORAL) 2 % cream  . lisinopril (ZESTRIL) 40 MG tablet  . metFORMIN (GLUCOPHAGE-XR) 500 MG 24 hr tablet  . metoprolol succinate (TOPROL-XL) 100 MG 24 hr tablet  . montelukast (SINGULAIR) 10 MG tablet  . Oxymetazoline HCl (NASAL SPRAY NA)  . pioglitazone (ACTOS) 15 MG tablet  . rosuvastatin (CRESTOR) 10 MG tablet  . triamcinolone cream (KENALOG) 0.1 %  . ascorbic acid (VITAMIN C) 500 MG tablet  . fluticasone (FLONASE) 50 MCG/ACT nasal spray  . linagliptin (TRADJENTA) 5 MG TABS tablet  . loratadine (CLARITIN) 10 MG tablet  . Multiple Vitamin (MULTIVITAMIN WITH MINERALS) TABS tablet  . thiamine 100 MG tablet  . zinc sulfate 220 (50 Zn) MG capsule    Marland Kitchen, PA-C Surgical Short Stay/Anesthesiology [facility] Phone [phone] [facility]  Phone [phone] 02/01/2021 12:55 PM

## 2021-02-01 NOTE — Anesthesia Preprocedure Evaluation (Addendum)
Anesthesia Evaluation  Patient identified by MRN, date of birth, ID band Patient awake    Reviewed: Allergy & Precautions, H&P , NPO status , Patient's Chart, lab work & pertinent test results  Airway Mallampati: II   Neck ROM: full    Dental   Pulmonary asthma , Patient abstained from smoking.,    breath sounds clear to auscultation       Cardiovascular hypertension,  Rhythm:regular Rate:Normal     Neuro/Psych    GI/Hepatic Umbilical hernia   Endo/Other  diabetes, Type 2  Renal/GU      Musculoskeletal   Abdominal   Peds  Hematology   Anesthesia Other Findings   Reproductive/Obstetrics                            Anesthesia Physical Anesthesia Plan  ASA: II  Anesthesia Plan: General   Post-op Pain Management:    Induction: Intravenous  PONV Risk Score and Plan: 2 and Ondansetron, Dexamethasone, Midazolam and Treatment may vary due to age or medical condition  Airway Management Planned: Oral ETT  Additional Equipment:   Intra-op Plan:   Post-operative Plan: Extubation in OR  Informed Consent: I have reviewed the patients History and Physical, chart, labs and discussed the procedure including the risks, benefits and alternatives for the proposed anesthesia with the patient or authorized representative who has indicated his/her understanding and acceptance.     Dental advisory given  Plan Discussed with: CRNA, Anesthesiologist and Surgeon  Anesthesia Plan Comments: (PAT note written 02/01/2021 by Shonna Chock, PA-C. )       Anesthesia Quick Evaluation

## 2021-02-02 ENCOUNTER — Encounter (HOSPITAL_COMMUNITY)
Admission: RE | Disposition: A | Discharge status: Home / Self Care | Payer: Self-pay | Source: Home / Self Care | Attending: General Surgery

## 2021-02-02 ENCOUNTER — Ambulatory Visit (HOSPITAL_COMMUNITY): Payer: BC Managed Care – PPO | Admitting: Vascular Surgery

## 2021-02-02 ENCOUNTER — Ambulatory Visit (HOSPITAL_COMMUNITY)
Admission: RE | Admit: 2021-02-02 | Discharge: 2021-02-02 | Disposition: A | Discharge status: Home / Self Care | Payer: BC Managed Care – PPO | Attending: General Surgery | Admitting: General Surgery

## 2021-02-02 ENCOUNTER — Encounter (HOSPITAL_COMMUNITY): Payer: Self-pay | Admitting: General Surgery

## 2021-02-02 ENCOUNTER — Other Ambulatory Visit: Payer: Self-pay

## 2021-02-02 DIAGNOSIS — Z8616 Personal history of COVID-19: Secondary | ICD-10-CM | POA: Insufficient documentation

## 2021-02-02 DIAGNOSIS — K429 Umbilical hernia without obstruction or gangrene: Secondary | ICD-10-CM | POA: Insufficient documentation

## 2021-02-02 DIAGNOSIS — Z79899 Other long term (current) drug therapy: Secondary | ICD-10-CM | POA: Diagnosis not present

## 2021-02-02 DIAGNOSIS — E785 Hyperlipidemia, unspecified: Secondary | ICD-10-CM | POA: Diagnosis not present

## 2021-02-02 DIAGNOSIS — Z7984 Long term (current) use of oral hypoglycemic drugs: Secondary | ICD-10-CM | POA: Insufficient documentation

## 2021-02-02 DIAGNOSIS — E1165 Type 2 diabetes mellitus with hyperglycemia: Secondary | ICD-10-CM | POA: Diagnosis not present

## 2021-02-02 DIAGNOSIS — I10 Essential (primary) hypertension: Secondary | ICD-10-CM | POA: Diagnosis not present

## 2021-02-02 HISTORY — PX: UMBILICAL HERNIA REPAIR: SHX196

## 2021-02-02 HISTORY — DX: Other seasonal allergic rhinitis: J30.2

## 2021-02-02 HISTORY — DX: Hyperlipidemia, unspecified: E78.5

## 2021-02-02 LAB — GLUCOSE, CAPILLARY
Glucose-Capillary: 126 mg/dL — ABNORMAL HIGH (ref 70–99)
Glucose-Capillary: 132 mg/dL — ABNORMAL HIGH (ref 70–99)
Glucose-Capillary: 138 mg/dL — ABNORMAL HIGH (ref 70–99)

## 2021-02-02 LAB — BASIC METABOLIC PANEL
Anion gap: 11 (ref 5–15)
BUN: 10 mg/dL (ref 6–20)
CO2: 20 mmol/L — ABNORMAL LOW (ref 22–32)
Calcium: 9.1 mg/dL (ref 8.9–10.3)
Chloride: 103 mmol/L (ref 98–111)
Creatinine, Ser: 0.83 mg/dL (ref 0.61–1.24)
GFR, Estimated: >60 mL/min (ref >60)
Glucose, Bld: 131 mg/dL — ABNORMAL HIGH (ref 70–99)
Potassium: 4.4 mmol/L (ref 3.5–5.1)
Sodium: 134 mmol/L — ABNORMAL LOW (ref 135–145)

## 2021-02-02 LAB — CBC
HCT: 46.2 % (ref 39.0–52.0)
Hemoglobin: 15.5 g/dL (ref 13.0–17.0)
MCH: 32.3 pg (ref 26.0–34.0)
MCHC: 33.5 g/dL (ref 30.0–36.0)
MCV: 96.3 fL (ref 80.0–100.0)
Platelets: 257 10*3/uL (ref 150–400)
RBC: 4.8 MIL/uL (ref 4.22–5.81)
RDW: 13 % (ref 11.5–15.5)
WBC: 8 10*3/uL (ref 4.0–10.5)
nRBC: 0 % (ref 0.0–0.2)

## 2021-02-02 SURGERY — REPAIR, HERNIA, UMBILICAL, LAPAROSCOPIC
Anesthesia: General

## 2021-02-02 MED ORDER — OXYCODONE HCL 5 MG/5ML PO SOLN: 5.0000 mg | Freq: Once | ORAL | Status: AC | PRN

## 2021-02-02 MED ORDER — ORAL CARE MOUTH RINSE: 15.0000 mL | Freq: Once | OROMUCOSAL | Status: AC

## 2021-02-02 MED ORDER — GABAPENTIN 300 MG PO CAPS
300.0000 mg | ORAL_CAPSULE | ORAL | Status: AC
  Administered 2021-02-02: 300 mg via ORAL

## 2021-02-02 MED ORDER — SUGAMMADEX SODIUM 200 MG/2ML IV SOLN
INTRAVENOUS | Status: DC | PRN
  Administered 2021-02-02: 200 mg via INTRAVENOUS

## 2021-02-02 MED ORDER — GLYCOPYRROLATE PF 0.2 MG/ML IJ SOSY
PREFILLED_SYRINGE | INTRAMUSCULAR | Status: AC
  Filled 2021-02-02: qty 1

## 2021-02-02 MED ORDER — LACTATED RINGERS IV SOLN: INTRAVENOUS | Status: DC

## 2021-02-02 MED ORDER — MIDAZOLAM HCL 2 MG/2ML IJ SOLN
INTRAMUSCULAR | Status: AC
  Filled 2021-02-02: qty 2

## 2021-02-02 MED ORDER — BUPIVACAINE HCL 0.25 % IJ SOLN
INTRAMUSCULAR | Status: DC | PRN
  Administered 2021-02-02: 8 mL

## 2021-02-02 MED ORDER — ROCURONIUM BROMIDE 10 MG/ML (PF) SYRINGE
PREFILLED_SYRINGE | INTRAVENOUS | Status: AC
  Filled 2021-02-02: qty 10

## 2021-02-02 MED ORDER — PHENYLEPHRINE 40 MCG/ML (10ML) SYRINGE FOR IV PUSH (FOR BLOOD PRESSURE SUPPORT)
PREFILLED_SYRINGE | INTRAVENOUS | Status: DC | PRN
  Administered 2021-02-02: 80 ug via INTRAVENOUS

## 2021-02-02 MED ORDER — FENTANYL CITRATE (PF) 250 MCG/5ML IJ SOLN
INTRAMUSCULAR | Status: DC | PRN
  Administered 2021-02-02: 100 ug via INTRAVENOUS

## 2021-02-02 MED ORDER — PHENYLEPHRINE 40 MCG/ML (10ML) SYRINGE FOR IV PUSH (FOR BLOOD PRESSURE SUPPORT)
PREFILLED_SYRINGE | INTRAVENOUS | Status: AC
  Filled 2021-02-02: qty 10

## 2021-02-02 MED ORDER — GABAPENTIN 300 MG PO CAPS
ORAL_CAPSULE | ORAL | Status: AC
  Filled 2021-02-02: qty 1

## 2021-02-02 MED ORDER — CEFAZOLIN SODIUM-DEXTROSE 2-4 GM/100ML-% IV SOLN
2.0000 g | INTRAVENOUS | Status: AC
  Administered 2021-02-02: 2 g via INTRAVENOUS

## 2021-02-02 MED ORDER — ONDANSETRON HCL 4 MG/2ML IJ SOLN
4.0000 mg | Freq: Four times a day (QID) | INTRAMUSCULAR | Status: AC | PRN
  Administered 2021-02-02: 4 mg via INTRAVENOUS

## 2021-02-02 MED ORDER — ONDANSETRON HCL 4 MG/2ML IJ SOLN
INTRAMUSCULAR | Status: DC | PRN
  Administered 2021-02-02: 4 mg via INTRAVENOUS

## 2021-02-02 MED ORDER — DEXAMETHASONE SODIUM PHOSPHATE 10 MG/ML IJ SOLN
INTRAMUSCULAR | Status: AC
  Filled 2021-02-02: qty 1

## 2021-02-02 MED ORDER — OXYCODONE HCL 5 MG PO TABS
ORAL_TABLET | ORAL | Status: AC
  Filled 2021-02-02: qty 1

## 2021-02-02 MED ORDER — ONDANSETRON HCL 4 MG/2ML IJ SOLN
INTRAMUSCULAR | Status: AC
  Filled 2021-02-02: qty 2

## 2021-02-02 MED ORDER — DEXAMETHASONE SODIUM PHOSPHATE 10 MG/ML IJ SOLN
INTRAMUSCULAR | Status: DC | PRN
  Administered 2021-02-02: 5 mg via INTRAVENOUS

## 2021-02-02 MED ORDER — BUPIVACAINE HCL (PF) 0.25 % IJ SOLN
INTRAMUSCULAR | Status: AC
  Filled 2021-02-02: qty 30

## 2021-02-02 MED ORDER — ACETAMINOPHEN 500 MG PO TABS
1000.0000 mg | ORAL_TABLET | ORAL | Status: AC
  Administered 2021-02-02: 1000 mg via ORAL

## 2021-02-02 MED ORDER — TRAMADOL HCL 50 MG PO TABS
50.0000 mg | ORAL_TABLET | Freq: Four times a day (QID) | ORAL | 0 refills | Status: AC | PRN
Start: 2021-02-02 — End: 2022-02-02

## 2021-02-02 MED ORDER — CEFAZOLIN SODIUM-DEXTROSE 2-4 GM/100ML-% IV SOLN
INTRAVENOUS | Status: AC
  Filled 2021-02-02: qty 100

## 2021-02-02 MED ORDER — CHLORHEXIDINE GLUCONATE 0.12 % MT SOLN
15.0000 mL | Freq: Once | OROMUCOSAL | Status: AC
  Administered 2021-02-02: 15 mL via OROMUCOSAL
  Filled 2021-02-02: qty 15

## 2021-02-02 MED ORDER — PROPOFOL 10 MG/ML IV BOLUS
INTRAVENOUS | Status: AC
  Filled 2021-02-02: qty 20

## 2021-02-02 MED ORDER — LIDOCAINE 2% (20 MG/ML) 5 ML SYRINGE
INTRAMUSCULAR | Status: DC | PRN
  Administered 2021-02-02: 40 mg via INTRAVENOUS

## 2021-02-02 MED ORDER — LIDOCAINE 2% (20 MG/ML) 5 ML SYRINGE
INTRAMUSCULAR | Status: AC
  Filled 2021-02-02: qty 5

## 2021-02-02 MED ORDER — MIDAZOLAM HCL 2 MG/2ML IJ SOLN
INTRAMUSCULAR | Status: DC | PRN
  Administered 2021-02-02: 2 mg via INTRAVENOUS

## 2021-02-02 MED ORDER — FENTANYL CITRATE (PF) 100 MCG/2ML IJ SOLN
25.0000 ug | INTRAMUSCULAR | Status: DC | PRN
  Administered 2021-02-02 (×3): 50 ug via INTRAVENOUS

## 2021-02-02 MED ORDER — ARTIFICIAL TEARS OPHTHALMIC OINT
TOPICAL_OINTMENT | OPHTHALMIC | Status: AC
  Filled 2021-02-02: qty 3.5

## 2021-02-02 MED ORDER — OXYCODONE HCL 5 MG PO TABS
5.0000 mg | ORAL_TABLET | Freq: Once | ORAL | Status: AC | PRN
  Administered 2021-02-02: 5 mg via ORAL

## 2021-02-02 MED ORDER — GLYCOPYRROLATE PF 0.2 MG/ML IJ SOSY
PREFILLED_SYRINGE | INTRAMUSCULAR | Status: DC | PRN
  Administered 2021-02-02: .2 mg via INTRAVENOUS

## 2021-02-02 MED ORDER — PROPOFOL 10 MG/ML IV BOLUS
INTRAVENOUS | Status: DC | PRN
  Administered 2021-02-02: 70 mg via INTRAVENOUS
  Administered 2021-02-02: 200 mg via INTRAVENOUS

## 2021-02-02 MED ORDER — CHLORHEXIDINE GLUCONATE CLOTH 2 % EX PADS: 6.0000 | MEDICATED_PAD | Freq: Once | CUTANEOUS | Status: DC

## 2021-02-02 MED ORDER — ACETAMINOPHEN 500 MG PO TABS
ORAL_TABLET | ORAL | Status: AC
  Filled 2021-02-02: qty 2

## 2021-02-02 MED ORDER — 0.9 % SODIUM CHLORIDE (POUR BTL) OPTIME
TOPICAL | Status: DC | PRN
  Administered 2021-02-02: 1000 mL

## 2021-02-02 MED ORDER — ROCURONIUM BROMIDE 10 MG/ML (PF) SYRINGE
PREFILLED_SYRINGE | INTRAVENOUS | Status: DC | PRN
  Administered 2021-02-02: 60 mg via INTRAVENOUS

## 2021-02-02 MED ORDER — FENTANYL CITRATE (PF) 100 MCG/2ML IJ SOLN
INTRAMUSCULAR | Status: AC
  Filled 2021-02-02: qty 2

## 2021-02-02 MED ORDER — FENTANYL CITRATE (PF) 250 MCG/5ML IJ SOLN
INTRAMUSCULAR | Status: AC
  Filled 2021-02-02: qty 5

## 2021-02-02 SURGICAL SUPPLY — 44 items
BLADE CLIPPER SURG (BLADE) IMPLANT
CANISTER SUCT 3000ML PPV (MISCELLANEOUS) ×2 IMPLANT
CHLORAPREP W/TINT 26 (MISCELLANEOUS) ×2 IMPLANT
COVER SURGICAL LIGHT HANDLE (MISCELLANEOUS) ×2 IMPLANT
COVER WAND RF STERILE (DRAPES) ×2 IMPLANT
DERMABOND ADHESIVE PROPEN (GAUZE/BANDAGES/DRESSINGS) ×1
DERMABOND ADVANCED (GAUZE/BANDAGES/DRESSINGS) ×1
DERMABOND ADVANCED .7 DNX12 (GAUZE/BANDAGES/DRESSINGS) ×1 IMPLANT
DERMABOND ADVANCED .7 DNX6 (GAUZE/BANDAGES/DRESSINGS) ×1 IMPLANT
DEVICE SECURE STRAP 25 ABSORB (INSTRUMENTS) ×6 IMPLANT
DEVICE TROCAR PUNCTURE CLOSURE (ENDOMECHANICALS) ×2 IMPLANT
ELECT REM PT RETURN 9FT ADLT (ELECTROSURGICAL) ×2
ELECTRODE REM PT RTRN 9FT ADLT (ELECTROSURGICAL) ×1 IMPLANT
GLOVE BIO SURGEON STRL SZ7.5 (GLOVE) ×2 IMPLANT
GOWN STRL REUS W/ TWL LRG LVL3 (GOWN DISPOSABLE) ×2 IMPLANT
GOWN STRL REUS W/ TWL XL LVL3 (GOWN DISPOSABLE) ×1 IMPLANT
GOWN STRL REUS W/TWL LRG LVL3 (GOWN DISPOSABLE) ×4
GOWN STRL REUS W/TWL XL LVL3 (GOWN DISPOSABLE) ×1
KIT BASIN OR (CUSTOM PROCEDURE TRAY) ×2 IMPLANT
KIT TURNOVER KIT B (KITS) ×2 IMPLANT
MARKER SKIN DUAL TIP RULER LAB (MISCELLANEOUS) ×2 IMPLANT
MESH VENTRALIGHT ST 6IN CRC (Mesh General) ×2 IMPLANT
NEEDLE INSUFFLATION 14GA 120MM (NEEDLE) ×2 IMPLANT
NEEDLE SPNL 22GX3.5 QUINCKE BK (NEEDLE) IMPLANT
NS IRRIG 1000ML POUR BTL (IV SOLUTION) ×2 IMPLANT
PAD ARMBOARD 7.5X6 YLW CONV (MISCELLANEOUS) ×4 IMPLANT
SCISSORS LAP 5X35 DISP (ENDOMECHANICALS) ×2 IMPLANT
SET IRRIG TUBING LAPAROSCOPIC (IRRIGATION / IRRIGATOR) IMPLANT
SET TUBE SMOKE EVAC HIGH FLOW (TUBING) ×2 IMPLANT
SLEEVE ENDOPATH XCEL 5M (ENDOMECHANICALS) ×2 IMPLANT
SUT CHROMIC 2 0 SH (SUTURE) ×2 IMPLANT
SUT ETHIBOND 0 36 GRN (SUTURE) ×2 IMPLANT
SUT ETHIBOND 0 MO6 C/R (SUTURE) IMPLANT
SUT MNCRL AB 4-0 PS2 18 (SUTURE) ×2 IMPLANT
SUT NOVA 1 T20/GS 25DT (SUTURE) IMPLANT
SUT PROLENE 2 0 KS (SUTURE) ×8 IMPLANT
TOWEL GREEN STERILE (TOWEL DISPOSABLE) ×2 IMPLANT
TOWEL GREEN STERILE FF (TOWEL DISPOSABLE) ×2 IMPLANT
TRAY LAPAROSCOPIC MC (CUSTOM PROCEDURE TRAY) ×2 IMPLANT
TROCAR XCEL BLUNT TIP 100MML (ENDOMECHANICALS) IMPLANT
TROCAR XCEL NON-BLD 11X100MML (ENDOMECHANICALS) IMPLANT
TROCAR XCEL NON-BLD 5MMX100MML (ENDOMECHANICALS) ×2 IMPLANT
WARMER LAPAROSCOPE (MISCELLANEOUS) ×2 IMPLANT
WATER STERILE IRR 1000ML POUR (IV SOLUTION) ×2 IMPLANT

## 2021-02-02 NOTE — Discharge Instructions (Signed)
[facility] _______[facility] ° °UMBILICAL OR INGUINAL HERNIA REPAIR: POST OP INSTRUCTIONS ° °Always review your discharge instruction sheet given to you by the facility where your surgery was performed. °IF YOU HAVE DISABILITY OR FAMILY LEAVE FORMS, YOU MUST BRING THEM TO THE OFFICE FOR PROCESSING.   °DO NOT GIVE THEM TO YOUR DOCTOR. ° °1. A  prescription for pain medication may be given to you upon discharge.  Take your pain medication as prescribed, if needed.  If narcotic pain medicine is not needed, then you may take acetaminophen (Tylenol) or ibuprofen (Advil) as needed. °2. Take your usually prescribed medications unless otherwise directed. °If you need a refill on your pain medication, please contact your pharmacy.  They will contact our office to request authorization. Prescriptions will not be filled after 5 pm or on week-ends. °3. You should follow a light diet the first 24 hours after arrival home, such as soup and crackers, etc.  Be sure to include lots of fluids daily.  Resume your normal diet the day after surgery. °4.Most patients will experience some swelling and bruising around the umbilicus or in the groin and scrotum.  Ice packs and reclining will help.  Swelling and bruising can take several days to resolve.  °6. It is common to experience some constipation if taking pain medication after surgery.  Increasing fluid intake and taking a stool softener (such as Colace) will usually help or prevent this problem from occurring.  A mild laxative (Milk of Magnesia or Miralax) should be taken according to package directions if there are no bowel movements after 48 hours. °7. Unless discharge instructions indicate otherwise, you may remove your bandages 24-48 hours after surgery, and you may shower at that time.  You may have steri-strips (small skin tapes) in place directly over the incision.  These strips should be left on the skin for 7-10 days.  If your surgeon used skin glue on the  incision, you may shower in 24 hours.  The glue will flake off over the next 2-3 weeks.  Any sutures or staples will be removed at the office during your follow-up visit. °8. ACTIVITIES:  You may resume regular (light) daily activities beginning the next day--such as daily self-care, walking, climbing stairs--gradually increasing activities as tolerated.  You may have sexual intercourse when it is comfortable.  Refrain from any heavy lifting or straining until approved by your doctor. ° °a.You may drive when you are no longer taking prescription pain medication, you can comfortably wear a seatbelt, and you can safely maneuver your car and apply brakes. °b.RETURN TO WORK:   °_____________________________________________ ° °9.You should see your doctor in the office for a follow-up appointment approximately 2-3 weeks after your surgery.  Make sure that you call for this appointment within a day or two after you arrive home to insure a convenient appointment time. °10.OTHER INSTRUCTIONS: _________________________ °   _____________________________________ ° °WHEN TO CALL YOUR DOCTOR: °1. Fever over 101.0 °2. Inability to urinate °3. Nausea and/or vomiting °4. Extreme swelling or bruising °5. Continued bleeding from incision. °6. Increased pain, redness, or drainage from the incision ° °The clinic staff is available to answer your questions during regular business hours.  Please don’t hesitate to call and ask to speak to one of the nurses for clinical concerns.  If you have a medical emergency, go to the nearest emergency room or call 911.  A surgeon from [facility] is always on call at the hospital ° ° °  [address], [address], [place], [place]  [address] ? ° [address] [address],  [place], [place]   [address] °[phone] ? [phone] ? FAX [phone] °Web site: [email or web address] °

## 2021-02-02 NOTE — Transfer of Care (Signed)
Immediate Anesthesia Transfer of Care Note  Patient: Earl Francis  Procedure(s) Performed: LAPAROSCOPIC UMBILICAL HERNIA REPAIR WITH MESH (N/A )  Patient Location: PACU  Anesthesia Type:General  Level of Consciousness: awake, alert  and oriented  Airway & Oxygen Therapy: Patient Spontanous Breathing and Patient connected to face mask oxygen  Post-op Assessment: Report given to RN and Post -op Vital signs reviewed and stable  Post vital signs: Reviewed and stable  Last Vitals:  Vitals Value Taken Time  BP 99/78 02/02/21 1133  Temp 36.3 C 02/02/21 1133  Pulse 61 02/02/21 1136  Resp 17 02/02/21 1136  SpO2 100 % 02/02/21 1136  Vitals shown include unvalidated device data.  Last Pain:  Vitals:   02/02/21 1133  TempSrc:   PainSc: 0-No pain         Complications: No complications documented.

## 2021-02-02 NOTE — H&P (Signed)
The patient is a 60 year old male who presents with an umbilical hernia.  Chief Complaint: Umbilical hernia  Patient is a 60 year old male who comes in secondary to umbilical hernia as well as rectus diastases.  Patient states he's been since the last several months. He does state that he notices some tightening sensation to his umbilicus and his waist. He states that his had no signs or symptoms of incarceration or granulation.  Patient's had no previous abdominal surgery. He is otherwise active and does do some heavy lifting at times.       Past Surgical History Knee Surgery  Left.  Diagnostic Studies History  Colonoscopy  1-5 years ago  Allergies No Known Drug Allergies  [10/23/2020]: Allergies Reconciled   Medication History Lisinopril (40MG  Tablet, Oral) Active. metFORMIN HCl ER (500MG  Tablet ER 24HR, Oral) Active. Metoprolol Succinate ER (100MG  Tablet ER 24HR, Oral) Active. OneTouch Verio (In Vitro) Active. Montelukast Sodium (10MG  Tablet, Oral) Active. Triamcinolone Acetonide (0.1% Cream, External) Active. Ketoconazole (2% Cream, External) Active. Pioglitazone HCl (15MG  Tablet, Oral) Active. Glimepiride (2MG  Tablet, Oral) Active. amLODIPine Besylate (5MG  Tablet, Oral) Active. Vitamin D (Oral) Specific strength unknown - Active. Medications Reconciled  Social History Alcohol use  Moderate alcohol use. Caffeine use  Tea. No drug use  Tobacco use  Never smoker.  Family History Cancer  Father. Diabetes Mellitus  Mother. Hypertension  Father, Mother.  Other Problems Asthma  Diabetes Mellitus  High blood pressure  Umbilical Hernia Repair     Review of Systems General Present- Appetite Loss. Not Present- Chills, Fatigue, Fever, Night Sweats, Weight Gain and Weight Loss. Skin Not Present- Change in Wart/Mole, Dryness, Hives, Jaundice, New Lesions, Non-Healing Wounds, Rash and Ulcer. HEENT Present- Seasonal  Allergies. Not Present- Earache, Hearing Loss, Hoarseness, Nose Bleed, Oral Ulcers, Ringing in the Ears, Sinus Pain, Sore Throat, Visual Disturbances, Wears glasses/contact lenses and Yellow Eyes. Respiratory Present- Snoring. Not Present- Bloody sputum, Chronic Cough, Difficulty Breathing and Wheezing. Breast Not Present- Breast Mass, Breast Pain, Nipple Discharge and Skin Changes. Cardiovascular Present- Leg Cramps and Swelling of Extremities. Not Present- Chest Pain, Difficulty Breathing Lying Down, Palpitations, Rapid Heart Rate and Shortness of Breath. Gastrointestinal Present- Indigestion. Not Present- Abdominal Pain, Bloating, Bloody Stool, Change in Bowel Habits, Chronic diarrhea, Constipation, Difficulty Swallowing, Excessive gas, Gets full quickly at meals, Hemorrhoids, Nausea, Rectal Pain and Vomiting. Male Genitourinary Not Present- Blood in Urine, Change in Urinary Stream, Frequency, Impotence, Nocturia, Painful Urination, Urgency and Urine Leakage. Musculoskeletal Not Present- Back Pain, Joint Pain, Joint Stiffness, Muscle Pain, Muscle Weakness and Swelling of Extremities. Neurological Not Present- Decreased Memory, Fainting, Headaches, Numbness, Seizures, Tingling, Tremor, Trouble walking and Weakness. Psychiatric Not Present- Anxiety, Bipolar, Change in Sleep Pattern, Depression, Fearful and Frequent crying. Endocrine Present- Cold Intolerance. Not Present- Excessive Hunger, Hair Changes, Heat Intolerance and New Diabetes. Hematology Not Present- Blood Thinners, Easy Bruising, Excessive bleeding, Gland problems, HIV and Persistent Infections. All other systems negative  BP (!) 154/77   Pulse 61   Temp 98.3 F (36.8 C) (Oral)   Resp 20   Ht 5' 10.5" (1.791 m)   Wt 111.1 kg   SpO2 98%   BMI 34.66 kg/m     Physical Exam The physical exam findings are as follows: Note: Constitutional: No acute distress, conversant, appears stated age  Eyes: Anicteric sclerae, moist  conjunctiva, no lid lag  Neck: No thyromegaly, trachea midline, no cervical lymphadenopathy  Lungs: Clear to auscultation biilaterally, normal respiratory effot  Cardiovascular: regular rate &  rhythm, no murmurs, no peripheal edema, pedal pulses 2+  GI: Soft, no masses or hepatosplenomegaly, non-tender to palpation  MSK: Normal gait, no clubbing cyanosis, edema  Skin: No rashes, palpation reveals normal skin turgor  Psychiatric: Appropriate judgment and insight, oriented to person, place, and time  Abdomen Inspection Hernias - Diastasis recti - Present. Umbilical hernia - Reducible.    Assessment & Plan  UMBILICAL HERNIA WITHOUT OBSTRUCTION AND WITHOUT GANGRENE (K42.9) Impression: Patient is a 60 year old male with primary umbilical hernia.  1. The patient will like to proceed to the operating room for laparoscopic umbilical hernia repair with mesh.  2. I discussed with the patient the signs and symptoms of incarceration and strangulation and the need to proceed to the ER should they occur.  3. I discussed with the patient the risks and benefits of the procedure to include but not limited to: Infection, bleeding, damage to surrounding structures, possible need for further surgery, possible nerve pain, and possible recurrence. The patient was understanding and wishes to proceed.

## 2021-02-02 NOTE — Anesthesia Procedure Notes (Signed)
Procedure Name: Intubation Date/Time: 02/02/2021 10:47 AM Performed by: Mayer Camel, CRNA Pre-anesthesia Checklist: Patient identified, Emergency Drugs available, Suction available and Patient being monitored Patient Re-evaluated:Patient Re-evaluated prior to induction Oxygen Delivery Method: Circle System Utilized Preoxygenation: Pre-oxygenation with 100% oxygen Induction Type: IV induction Ventilation: Mask ventilation without difficulty and Oral airway inserted - appropriate to patient size Laryngoscope Size: Hyacinth Meeker and 2 Grade View: Grade I Tube type: Oral Tube size: 7.5 mm Number of attempts: 1 Airway Equipment and Method: Stylet and Oral airway Placement Confirmation: ETT inserted through vocal cords under direct vision,  positive ETCO2 and breath sounds checked- equal and bilateral Secured at: 22 cm Tube secured with: Tape Dental Injury: Teeth and Oropharynx as per pre-operative assessment

## 2021-02-02 NOTE — Op Note (Signed)
02/02/2021  11:23 AM  PATIENT:  Earl Francis  60 y.o. male  PRE-OPERATIVE DIAGNOSIS:  UMBILICAL HERNIA  POST-OPERATIVE DIAGNOSIS:  UMBILICAL HERNIA  PROCEDURE:  Procedure(s): LAPAROSCOPIC UMBILICAL HERNIA REPAIR WITH MESH (N/A)  SURGEON:  Surgeon(s) and Role:    * Axel Filler, MD - Primary  ANESTHESIA:   local and general  EBL:  5 mL   BLOOD ADMINISTERED:none  DRAINS: none   LOCAL MEDICATIONS USED:  BUPIVICAINE   SPECIMEN:  No Specimen  DISPOSITION OF SPECIMEN:  N/A  COUNTS:  YES  TOURNIQUET:  * No tourniquets in log *  DICTATION: .Dragon Dictation   Details of the procedure:   After the patient was consented patient was taken back to the operating room patient was then placed in supine position bilateral SCDs in place.  The patient was prepped and draped in the usual sterile fashion. After antibiotics were confirmed a timeout was called and all facts were verified. The Veress needle technique was used to insuflate the abdomen at Palmer's point. The abdomen was insufflated to 14 mm mercury. Subsequently a 5 mm trocar was placed a camera inserted there was no injury to any intra-abdominal organs.    There was seen to be an non-incarcerated  umbilical hernia.  A second camera port was in placed into the left lower quadrant.   At this the Falicform ligament was taken down with Bovie cautery maintaining hemostasis.  I proceeded to reduce the hernia contents.  The hernia sac was dissected out of the hernia and disposed.  The fascia at the hernia was reapproximated using a #0 Eithbond x 1.  Once the hernia was cleared away, a Bard Ventralight 15.2cm  mesh was inserted into the abdomen.  The mesh was secured circumferentially with am Securestrap tacker in a double crown fashion.  2-0 prolenes were used at 3:00, 6:00, 9:00, and 12:00 as transfascial sutures using a endoclose decive.    The omentum was brought over the area of the mesh. The pneumoperitoneum was evacuated  &  all trocars  were removed. The skin was reapproximated with 4-0  Monocryl sutures in a subcuticular fashion. The skin was dressed with Dermabond.  The patient was taken to the recovery room in stable condition.  Type of repair -primary suture & mesh  Mesh overlap - 6cm  Placement of mesh -  beneath fascia and into peritoneal cavity  PLAN OF CARE: Discharge to home after PACU  PATIENT DISPOSITION:  PACU - hemodynamically stable.   Delay start of Pharmacological VTE agent (>24hrs) due to surgical blood loss or risk of bleeding: not applicable

## 2021-02-03 ENCOUNTER — Encounter (HOSPITAL_COMMUNITY): Payer: Self-pay | Admitting: General Surgery

## 2021-02-03 NOTE — Anesthesia Postprocedure Evaluation (Signed)
Anesthesia Post Note  Patient: Earl Francis  Procedure(s) Performed: LAPAROSCOPIC UMBILICAL HERNIA REPAIR WITH MESH (N/A )     Patient location during evaluation: PACU Anesthesia Type: General Level of consciousness: awake and alert Pain management: pain level controlled Vital Signs Assessment: post-procedure vital signs reviewed and stable Respiratory status: spontaneous breathing, nonlabored ventilation, respiratory function stable and patient connected to nasal cannula oxygen Cardiovascular status: blood pressure returned to baseline and stable Postop Assessment: no apparent nausea or vomiting Anesthetic complications: no   No complications documented.  Last Vitals:  Vitals:   02/02/21 1244 02/02/21 1245  BP:  132/79  Pulse: 68 69  Resp: 13 16  Temp:    SpO2: 100% 99%    Last Pain:  Vitals:   02/02/21 1216  TempSrc:   PainSc: 6                  Lometa Riggin S

## 2021-04-28 DIAGNOSIS — J309 Allergic rhinitis, unspecified: Secondary | ICD-10-CM | POA: Diagnosis not present

## 2021-04-28 DIAGNOSIS — E119 Type 2 diabetes mellitus without complications: Secondary | ICD-10-CM | POA: Diagnosis not present

## 2021-04-28 DIAGNOSIS — E782 Mixed hyperlipidemia: Secondary | ICD-10-CM | POA: Diagnosis not present

## 2021-04-28 DIAGNOSIS — I10 Essential (primary) hypertension: Secondary | ICD-10-CM | POA: Diagnosis not present

## 2021-06-01 IMAGING — DX DG CHEST 1V PORT
1 series · 1 of 1 positions shown · non-contrast
Comparison: None.

CLINICAL DATA: [id number] positive.  Short of breath

EXAM:
PORTABLE CHEST 1 VIEW

[chest ap]
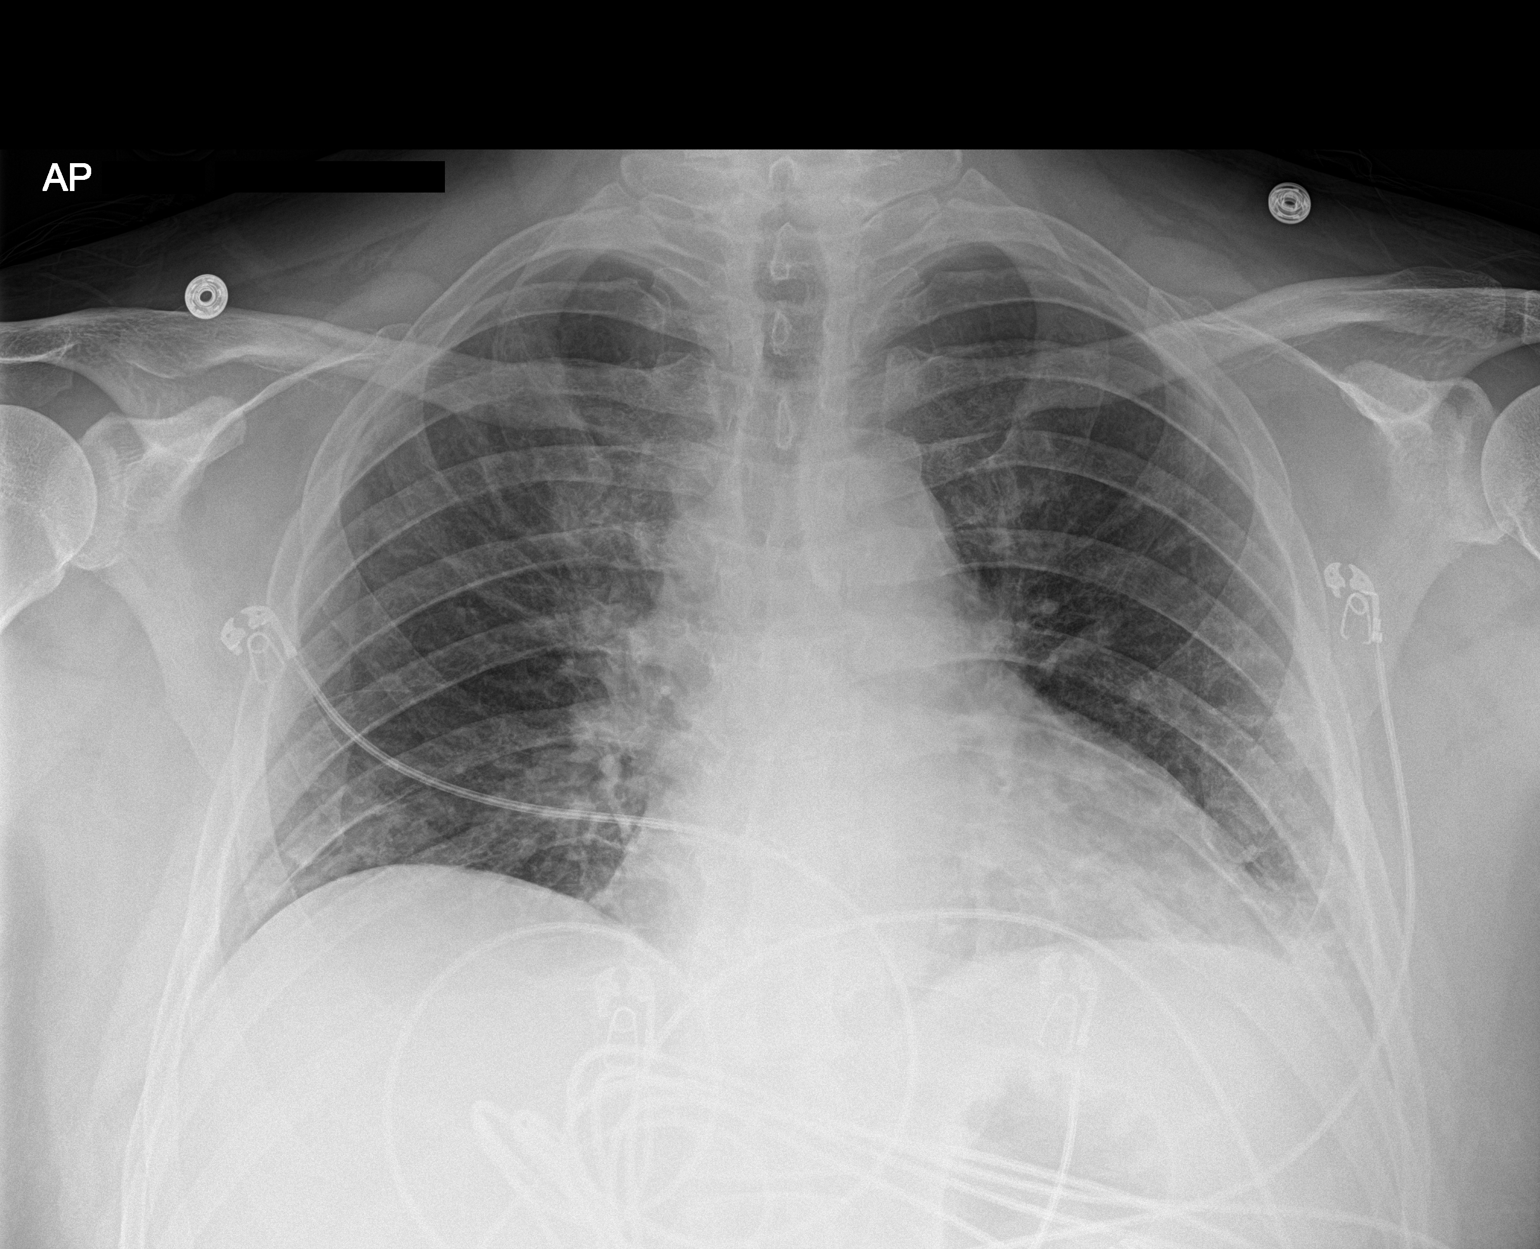

[1 of 1 positions shown; findings below may reference images not displayed]

FINDINGS: Mild bibasilar airspace disease left greater than right. Possible
pneumonia or atelectasis. Decreased lung volume.

Negative for heart failure or effusion.
IMPRESSION: Mild bibasilar airspace disease which could be pneumonia or
atelectasis. Hypoventilation with decreased lung volume.

## 2021-06-16 DIAGNOSIS — L259 Unspecified contact dermatitis, unspecified cause: Secondary | ICD-10-CM | POA: Diagnosis not present

## 2021-08-23 DIAGNOSIS — H5201 Hypermetropia, right eye: Secondary | ICD-10-CM | POA: Diagnosis not present

## 2021-08-23 DIAGNOSIS — H524 Presbyopia: Secondary | ICD-10-CM | POA: Diagnosis not present

## 2021-08-23 DIAGNOSIS — E119 Type 2 diabetes mellitus without complications: Secondary | ICD-10-CM | POA: Diagnosis not present

## 2021-08-25 DIAGNOSIS — N492 Inflammatory disorders of scrotum: Secondary | ICD-10-CM | POA: Diagnosis not present

## 2021-09-10 ENCOUNTER — Other Ambulatory Visit: Payer: Self-pay | Admitting: Family Medicine

## 2021-09-10 DIAGNOSIS — N5089 Other specified disorders of the male genital organs: Secondary | ICD-10-CM

## 2021-09-30 DIAGNOSIS — L309 Dermatitis, unspecified: Secondary | ICD-10-CM | POA: Diagnosis not present

## 2021-09-30 DIAGNOSIS — L821 Other seborrheic keratosis: Secondary | ICD-10-CM | POA: Diagnosis not present

## 2021-09-30 DIAGNOSIS — D1801 Hemangioma of skin and subcutaneous tissue: Secondary | ICD-10-CM | POA: Diagnosis not present

## 2021-09-30 DIAGNOSIS — L814 Other melanin hyperpigmentation: Secondary | ICD-10-CM | POA: Diagnosis not present

## 2021-10-19 DIAGNOSIS — E782 Mixed hyperlipidemia: Secondary | ICD-10-CM | POA: Diagnosis not present

## 2021-10-19 DIAGNOSIS — I10 Essential (primary) hypertension: Secondary | ICD-10-CM | POA: Diagnosis not present

## 2021-10-19 DIAGNOSIS — E119 Type 2 diabetes mellitus without complications: Secondary | ICD-10-CM | POA: Diagnosis not present

## 2021-10-19 DIAGNOSIS — Z125 Encounter for screening for malignant neoplasm of prostate: Secondary | ICD-10-CM | POA: Diagnosis not present

## 2021-10-19 DIAGNOSIS — J309 Allergic rhinitis, unspecified: Secondary | ICD-10-CM | POA: Diagnosis not present

## 2021-10-19 DIAGNOSIS — R591 Generalized enlarged lymph nodes: Secondary | ICD-10-CM | POA: Diagnosis not present

## 2022-10-19 DIAGNOSIS — E119 Type 2 diabetes mellitus without complications: Secondary | ICD-10-CM | POA: Diagnosis not present

## 2022-10-19 DIAGNOSIS — E782 Mixed hyperlipidemia: Secondary | ICD-10-CM | POA: Diagnosis not present

## 2022-10-19 DIAGNOSIS — I10 Essential (primary) hypertension: Secondary | ICD-10-CM | POA: Diagnosis not present

## 2022-10-19 DIAGNOSIS — J309 Allergic rhinitis, unspecified: Secondary | ICD-10-CM | POA: Diagnosis not present

## 2023-08-30 DIAGNOSIS — H524 Presbyopia: Secondary | ICD-10-CM | POA: Diagnosis not present

## 2023-08-30 DIAGNOSIS — H2513 Age-related nuclear cataract, bilateral: Secondary | ICD-10-CM | POA: Diagnosis not present

## 2023-08-30 DIAGNOSIS — E119 Type 2 diabetes mellitus without complications: Secondary | ICD-10-CM | POA: Diagnosis not present

## 2024-09-10 ENCOUNTER — Other Ambulatory Visit (HOSPITAL_BASED_OUTPATIENT_CLINIC_OR_DEPARTMENT_OTHER): Payer: Self-pay
